# Patient Record
Sex: Female | Born: 1990 | Race: White | Hispanic: No | Marital: Single | Smoking: Never smoker
Health system: Southern US, Academic
[De-identification: ages and names within clinical notes are randomized; demographics above are authoritative.]

## PROBLEM LIST (undated history)

## (undated) DIAGNOSIS — Z803 Family history of malignant neoplasm of breast: Secondary | ICD-10-CM

## (undated) HISTORY — DX: Family history of malignant neoplasm of breast: Z80.3

## (undated) HISTORY — PX: HX OTHER: 2100001105

---
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
## 2010-01-04 ENCOUNTER — Ambulatory Visit
Payer: No Typology Code available for payment source | Attending: Cardiovascular Disease | Admitting: Cardiovascular Disease

## 2010-01-04 ENCOUNTER — Encounter (HOSPITAL_BASED_OUTPATIENT_CLINIC_OR_DEPARTMENT_OTHER): Payer: Self-pay | Admitting: Cardiovascular Disease

## 2010-01-04 DIAGNOSIS — R002 Palpitations: Secondary | ICD-10-CM | POA: Insufficient documentation

## 2010-01-04 NOTE — Patient Instructions (Signed)
Please call us if you have problems or questions.     [facility] Call Center:       Phone:  [phone]      Toll free: [phone]  [877 "[place] 4 HRT"]     These numbers are answered from about 7:30 AM - 5:30 PM Monday-Friday.   If you call after hours, there should be an option to leave a message or call   another number, through which you should be able to reach us.

## 2010-01-04 NOTE — Progress Notes (Signed)
See dictation. Please refer to scanned documents for additional details regarding review of systems, past medical and surgical histories, and family history.    Good response to beta-blocker; happy to continue.    rtc prn; f/u with primary.

## 2010-07-07 ENCOUNTER — Encounter (INDEPENDENT_AMBULATORY_CARE_PROVIDER_SITE_OTHER): Payer: Self-pay

## 2010-07-07 ENCOUNTER — Ambulatory Visit (INDEPENDENT_AMBULATORY_CARE_PROVIDER_SITE_OTHER): Payer: No Typology Code available for payment source | Admitting: Family Medicine

## 2010-07-07 NOTE — Progress Notes (Signed)
[facility]    Patient Name:  Christina Gibson  MRN:  [id number]  DOB:  01-27-1991  Date of Service: 07/07/2010    Chief Complaint   Patient presents with   . Knee Injury       History of Present Illness: TERIYAH PURINGTON is a 20 y.o. female who [facility] for the above complaint.    How did the injury occur? Left knee pain after playing basketball on 3/13 with friend. Had pain while playing and then more with stairs  Used an ace and icy hot that helped  Since stiff some mild pain until yesterday while walking on flat ground with pain and popping   now stiffness and popping and pain is on and off  No past injury or surgery    Past Medical History:    Past Medical History   Diagnosis Date   . Chest pain on exertion 11/02/2009   . Exertional dyspnea 11/02/2009   . Dizziness 11/02/2009       Past Surgical History:    History reviewed.  No pertinent past surgical history.  Allergies:  Allergies   Allergen Reactions   . Amoxicillin Hives/ Urticaria   . Pollen Extracts Itching and  Other Adverse Reaction (Add comment)   . Cat Hair Extract (Allergenic Extracts) Hives/ Urticaria       Medications:  Outpatient prescriptions marked as taking for the 07/07/10 encounter (Office Visit) with Berna Spare, Kelby Lotspeich BETH   Medication Sig   . metoprolol succinate (TOPROL-XL) 25 mg Tb24 take 1 Tab by mouth Once a day.        Social History:    History   Social History   . Marital Status: Single     Spouse Name: N/A     Number of Children: N/A   . Years of Education: N/A   Social History Main Topics   . Smoking status: Never Smoker    . Smokeless tobacco: Never Used   . Alcohol Use: Yes      occasional alcohol intake   . Drug Use: Not on file   . Sexually Active: Not on file   Other Topics Concern   . Not on file   Social History Narrative   . No narrative on file       Family History:  No family history on file.  Review of Systems:ROS is as per HPI and Cardiovascular: negative  Physical Exam:   Vital signs: BP 104/65   Pulse 66   Temp 36.8 C (98.2 F)   Ht 1.6 m (5\' 3" )   Wt 70.761 kg (156 lb)   BMI 27.63 kg/m2   LMP 06/22/2010  General: appears in good health  Musculoskeletal exam:   Left Knee: neg effusion no joint line tenderness  Area of pian under patella  FROM  Neg grind      Data Reviewed:  No labs or x-rays performed today.    Assessment:   1. 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 Knee pain (719.46J)      Plan:  No orders of the defined types were placed in this encounter.     Rest, ice, compression, and elevation Ibuprofen 200 mg, 3-4 PO TID, take with food PT discussed she is interested in PT currently but will consider in future.    Medications as ordered. I advised the patient to abstain from drinking any alcohol or using any drugs while ill and/or being treated with  the prescription medications given today.  Return if symptoms worsen or fail to improve.    Gemma Payor, MD

## 2010-09-20 NOTE — Letter (Signed)
[facility]  [address]  [place], [place] [address]    PATIENT NAME: Christina Gibson, Christina Gibson  CHART NUMBER: [id number]  DATE OF BIRTH: 1991/04/13  DATE OF SERVICE: 11/02/2009    November 02, 2009      Edison Pace, DO   [address]  [place], [place] [address]    Dear Dr. Ree Shay:    I saw Christina Gibson as a [facility] at the [facility] on November 02, 2009.  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  Later she became aware of exertional "heart pounding" which began in the chest and radiated to her neck and temples.  Episodes were sometimes accompanied by dizziness, shortness of breath, and chest discomfort.  Last week she experienced similar symptoms, which were the worst that she has ever had.  These lasted about 10-20 minutes.  Her symptoms occur only with exercise and never at rest.  She has no established history of heart disease or other significant medical problems.  She had a Holter monitor on September 29, 2009.  The interpretation noted that "patient had quite a bit of tachycardia."  The heart rate ranged from 47-195 beats a minute with a mean rate of 77 beats a minute.  There were 38 ventricular ectopics and 202 supraventricular ectopics.  The longest supraventricular run lasted 15 beats at a rate of 144 beats a minute and was recorded at 19:22:13.    Past history is negative for hypertension, diabetes, thyroid disease or rheumatic fever.  She has undergone removal of a cyst from her ear and oral surgery.  The family history is positive for "heart attack and stroke" in grandparents.    Review of systems was otherwise noncontributory.    She is on no medications.  She is allergic to amoxicillin, pollen extracts, and cat hair extract.     On exam, she was alert and in no apparent distress.  She was accompanied by her mother.  The blood pressure was 110/68.  The pulse was 86 and regular.  Respirations were regular and unlabored.  The room air oxygen saturation was 98%.  She weighed 156 pounds.  The body mass index was 27.6.  The face appeared normal.  There was no cutaneous rash or scleral icterus.  There was no jugular venous distention or carotid bruit.  Carotid upstrokes were normal.  I palpated no cervical adenopathy.  The isthmus of the thyroid was palpable, but otherwise there was no thyromegaly.  The spine was straight.  The chest was clear.  Heart sounds were regular and normal.  I heard no murmur, gallop or rub.  Radial pulses were intact.  There was no abdominal bruit.  There was no pedal edema.  Neurologically, she was grossly intact.    An electrocardiogram today showed normal sinus rhythm at 63 beats a minute.  There was an rSr prime pattern in V1 and V2, but the QRS duration was 84 ms, indicating a normal variant.    In summary, she has had palpitations, dyspnea, and chest discomfort exclusively with exercise.  She has a normal cardiovascular exam and EKG.  At this point, there is little evidence for significant cardiovascular disease.    To look into things further, I have ordered a treadmill exercise test, blood work,  CXR, and an echocardiogram.  She will return in three weeks to review the results, and I will try to keep you updated as things progress.  If there is anything important in her history which I have overlooked, please contact me at your earliest convenience.    Sincerely,      Gean Quint, MD  Professor, Section of Cardiology  [facility] Department of Medicine    [id number]; D: 11/02/2009 08:52:10; T: 11/02/2009 09:13:15    cc: Dietrich Pates PA-C      [address]      [place], [place] [address]

## 2010-09-20 NOTE — Letter (Signed)
[facility]  [address]  [place], [place] [address]    PATIENT NAME: Christina Gibson, Christina Gibson  CHART NUMBER: [id number]  DATE OF BIRTH: 1991-02-18  DATE OF SERVICE: 01/04/2010    January 04, 2010    Edison Pace, DO  [address]  [place], [place] [address]    Dear [name]:    I saw Christina Gibson in followup at the [facility] on January 04, 2010.  I previously saw her here on November 23, 2009, and her history is further detailed in my note of that date.  At that visit, we started her on Toprol-XL 25 mg daily to treat exercise related palpitations and she returned today to follow up on the results.    In general, she has felt much improved on the beta-blocker treatment.  She has taken it regularly, and if she omits a dose she is more bothered by exercise related tachycardia.    On exam, she was alert and pleasant.  Blood pressure was 116/64.  The pulse was 71 and regular.  Respirations were regular and unlabored.  The spine was straight.  The chest was clear.  Heart sounds were normal.  I heard no murmur, gallop or rub.    In summary, she has had a good symptomatic improvement on low dose beta-blocker therapy.    As I explained to her, our goal of treatment is symptom control.  It is fine with me for her to continue the beta-blocker indefinitely if this makes her feel better.    I have not scheduled a return visit, but I will be happy to see her in the future at any time you feel this might be helpful.  Please let me know if I can answer any questions.      Sincerely,      Gean Quint, MD  Professor, Section of Cardiology  [facility] Department of Medicine    [id number]; D: 01/04/2010 13:59:14; T: 01/04/2010 18:28:14

## 2010-09-20 NOTE — Letter (Signed)
[facility]  [address]  [place], [place] [address]    PATIENT NAME: Christina Gibson, Christina Gibson  CHART NUMBER: [id number]  DATE OF BIRTH: 07-06-1990  DATE OF SERVICE: 11/23/2009    November 23, 2009      Edison Pace, DO   [address]  [place], [place] [address]    Dear [name]:     I saw [name] in followup on November 23, 2009.  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  At her last visit, we ordered a battery of studies and she returned to review the results.  She has had no significant change in symptoms since that visit.    On exam, she was alert and pleasant.  The blood pressure was 108/78.  The aide recorded a pulse of 115 following today's exercise test.  The pulse was regular and on my exam somewhat slower.  The room air oxygen saturation was 96%.  She weighed 158 pounds.  The body mass index was 27.68.  There was no jugular venous distention.  The spine was straight.  The chest was clear.  Heart sounds were regular and normal.  I heard no murmur, gallop or rub.    I reviewed with her and her mother the results of her recent studies.  Blood work here showed a normal hemoglobin of 13.8 and a normal TSH of 2.9.  Her chest x-ray was normal.  An echocardiogram November 02, 2009, was normal and showed normal heart size, thickness, and function with no significant valve abnormalities.    Today on the treadmill she exercised 8 minutes 55 seconds on the Bruce protocol and achieved a peak workload of 10.1 METs.  At peak exertion, she had sinus tachycardia with a rate of 203 beats a minute.  With this, she had her symptoms of dyspnea, heart pounding, and chest discomfort.  There were no signs of ischemia.  Her heart rate was a bit slow to decrease following exertion, but this may be due to some element of anxiety and deconditioning.     In summary, there is no real evidence for significant heart disease.  Her symptoms occurred during sinus tachycardia.    I offered her an empiric trial of beta-blocker therapy and we will start this today with Toprol-XL 25 mg daily.  I will plan to see her back in two months.  I encouraged her to remain active but not push herself to the point of significant symptoms.  Hopefully, she can gradually recondition and work a bit on weight loss.  She knows to stop the Toprol if it causes untoward effects.  I will try to keep you updated as things progress.  Please let me know if I can answer any questions.    Sincerely,      Gean Quint, MD  Professor, Section of Cardiology  [facility] Department of Medicine    [id number]; D: 11/23/2009 14:10:02; T: 11/23/2009 19:21:44

## 2011-01-06 ENCOUNTER — Encounter (HOSPITAL_BASED_OUTPATIENT_CLINIC_OR_DEPARTMENT_OTHER): Payer: Self-pay | Admitting: Cardiovascular Disease

## 2011-01-06 NOTE — Progress Notes (Signed)
[facility] - 01/05/11 - 12:46 PM More Detail >>         RX Refill       Christina Gibson           Sent: Thu January 05, 2011 12:46 PM     To: P [facility]                  Marco Collie   20 y.o. / Female (June 14, 1990)    MRN: [id number]   PCP: Edison Pace, DO   Wt: Not available    Home: [phone]   Work: [phone]   Mobile: [phone]           Documentation          Summary: RX Refill      >> [name] 01/05/2011 12:46 PM  Patient would like a prescription for Metoprolol 25 mg called into [facility] on [address] in [place] (she doesn't know their phone number but she states she does not want it sent to [facility] in [facility]). Patient of Dr. Noreene Filbert.            Left 2 messages for pt, she has not returned the calls.  Pt not following with Dr Noreene Filbert now, needs to get Rx from her PCP.  Spoke with pharmacist, Rx already filled by another physician.        Call History            Type Contact Phone User     01/05/2011 12:44 PM Phone (Incoming) [address], Areyanna Figeroa (Self) [phone] Rexene Edison) Christina Gibson

## 2012-06-10 ENCOUNTER — Ambulatory Visit (HOSPITAL_BASED_OUTPATIENT_CLINIC_OR_DEPARTMENT_OTHER): Payer: Self-pay | Admitting: Multi-Specialty

## 2012-07-04 ENCOUNTER — Encounter (HOSPITAL_BASED_OUTPATIENT_CLINIC_OR_DEPARTMENT_OTHER): Payer: Self-pay | Admitting: Multi-Specialty

## 2012-07-04 ENCOUNTER — Ambulatory Visit
Admission: RE | Admit: 2012-07-04 | Discharge: 2012-07-04 | Disposition: A | Discharge status: Home / Self Care | Payer: No Typology Code available for payment source | Source: Ambulatory Visit | Attending: Multi-Specialty | Admitting: Multi-Specialty

## 2012-07-04 ENCOUNTER — Other Ambulatory Visit (HOSPITAL_BASED_OUTPATIENT_CLINIC_OR_DEPARTMENT_OTHER): Payer: Self-pay | Admitting: Multi-Specialty

## 2012-07-04 ENCOUNTER — Ambulatory Visit (HOSPITAL_BASED_OUTPATIENT_CLINIC_OR_DEPARTMENT_OTHER): Payer: No Typology Code available for payment source | Admitting: Multi-Specialty

## 2012-07-04 VITALS — BP 93/66 | HR 80 | Temp 97.5°F | Ht 62.8 in | Wt 161.4 lb

## 2012-07-04 DIAGNOSIS — Z124 Encounter for screening for malignant neoplasm of cervix: Secondary | ICD-10-CM | POA: Insufficient documentation

## 2012-07-04 DIAGNOSIS — R000 Tachycardia, unspecified: Secondary | ICD-10-CM | POA: Insufficient documentation

## 2012-07-04 DIAGNOSIS — Z0000 Encounter for general adult medical examination without abnormal findings: Secondary | ICD-10-CM | POA: Insufficient documentation

## 2012-07-04 DIAGNOSIS — J301 Allergic rhinitis due to pollen: Secondary | ICD-10-CM | POA: Insufficient documentation

## 2012-07-04 DIAGNOSIS — E049 Nontoxic goiter, unspecified: Secondary | ICD-10-CM | POA: Insufficient documentation

## 2012-07-05 NOTE — Progress Notes (Signed)
[facility]                                     [facility]      PATIENT NAME:             Christina Gibson, Christina Gibson                   MEDICAL RECORD NUMBER:    [id number]  DATE OF BIRTH:            [birth year]      DATE OF SERVICE:          07/04/2012    CHIEF COMPLAINT:   Establish care, physical.    HISTORY OF PRESENT ILLNESS:  The patient is a 22 year old female who presents to clinic for the above reason.  No acute issues or concerns today.      PAST MEDICAL HISTORY:  Significant for tachycardia with exercise.  She actually was prescribed Toprol-XL 25 mg--was seen by Dr. Noreene Filbert.  She apparently was told to take this daily but only uses it with exercise, just reports that her heart rate becomes very elevated with exercise and the Toprol helps with this.  When not exercising, she is not symptomatic. She does report that her blood pressure tends to run on the low side.  She also has a history of seasonal allergies.      PAST SURGICAL HISTORY:  Include a cyst removed from her ear and some dental implants.      CURRENT MEDICATION:  Toprol-XL 25 mg daily.    ALLERGIES:  She has allergies to amoxicillin, cat hair and pollen.    FAMILY HISTORY:  Remarkable for coronary artery disease in paternal and maternal grandfathers.  There is hyperlipidemia in maternal and paternal grandmothers.  Paternal grandfather also had a stroke due to an aneurysm.  Mother has seizure disorder.    SOCIAL HISTORY:  She is a Holiday representative, Therapist, art at [employer].  She also has a job in Consulting civil engineer.  She is sexually active, has a female partner.  She smokes occasionally.  Denies drug use other than occasional marijuana.  She does drink sometimes on the weekends, but typically will only have 3-4 drinks, does not typically binge.     REVIEW OF SYSTEMS:  She has never had a Pap smear.  Denies vaginal discharge or lesions.  Menstrual cycles are regular.  She does have sinus congestion, rhinorrhea, has not tried anything really for those symptoms.  She also has a history of tachycardia.  No chest pain or shortness of breath.  No abdominal pain. ROS of 10 systems are otherwise negative.    OBJECTIVE:  Vitals are stable.  Blood pressure is actually on the low side, 93/66, pulse 80, afebrile.  Weight is 161 pounds with BMI of 28.  She is alert and oriented, pleasant, well appearing, in no distress.  HEENT:  NC/AT, TMs clear bilaterally.  EACs clear bilaterally.  Pupils equally round, reactive to light with no conjunctival injection.  Oropharynx is pink and moist with no oral lesions.  Neck:  Supple.  She does have what feels like some diffuse thyromegaly but no nodules are noted.  No lymphadenopathy.  Heart:  Regular rate and rhythm without murmur, rub or gallop.  No ectopic beats.  Lungs:  Clear to auscultation bilaterally without crackles or wheezes.  Breast exam:  Reveals no  discrete masses.  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  There is no nipple discharge, no overlying skin changes.  Abdomen:  Soft and nontender with no palpable masses, no palpable hepatosplenomegaly.  Extremities:  Without any pitting edema.  Neuro:  No focal deficits.  Musculoskeletal exam:  Reveals no joint swelling.  Skin exam:  Reveals no rash and no suspicious nevi.  She does have very pale skin GU:  External genitalia without lesions.  Vaginal mucosa is pink and moist.  Cervix is well-appearing. There is no cervical discharge and no cervical motion tenderness.  Uterus is small.  Adnexa are without masses or fullness.    ASSESSMENT AND PLAN:  1.  Healthcare maintenance.  Pap smear performed today.  She reports being up to date on all of her immunizations.   2.  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  This could just be that she has more thick and prominent tissue in this area.  Will get a thyroid ultrasound for further evaluation.  3.  History of tachycardia.  I am actually concerned about the Toprol because her blood pressure already runs low.  I am concerned about dropping her too low and actually causing syncopal episode or other problems.  I advised her to decrease her Toprol to 12.5 mg when she does take it.   4.  Seasonal allergies.  Consider antihistamines versus nasal steroids.  Can discuss more in the future if symptoms become bothersome.  Right now they are not really too significant per patient.      Shaune Pascal, MD  Assistant Professor  [facility]    [id number]; D: 07/04/2012 19:35:20; T: 07/05/2012 10:07:49

## 2012-07-09 LAB — HISTORICAL CYTOPATHOLOGY-GYN (PAP AND HPV TESTS)

## 2012-07-19 ENCOUNTER — Ambulatory Visit
Admission: RE | Admit: 2012-07-19 | Discharge: 2012-07-19 | Disposition: A | Discharge status: Home / Self Care | Payer: No Typology Code available for payment source | Source: Ambulatory Visit | Attending: Multi-Specialty | Admitting: Multi-Specialty

## 2012-07-19 DIAGNOSIS — E049 Nontoxic goiter, unspecified: Secondary | ICD-10-CM | POA: Insufficient documentation

## 2012-07-19 NOTE — Progress Notes (Signed)
Quick Note:    Your thyroid ultrasound is normal. No nodules or other abnormalities. Let me know if you have any questions.  Shaune Pascal, MD      [facility]  [address]  [place] [address]    ______

## 2012-07-31 ENCOUNTER — Encounter: Payer: Self-pay | Admitting: Multi-Specialty

## 2012-12-04 ENCOUNTER — Encounter (HOSPITAL_BASED_OUTPATIENT_CLINIC_OR_DEPARTMENT_OTHER): Payer: Self-pay | Admitting: Multi-Specialty

## 2012-12-24 ENCOUNTER — Ambulatory Visit (HOSPITAL_BASED_OUTPATIENT_CLINIC_OR_DEPARTMENT_OTHER): Payer: No Typology Code available for payment source | Admitting: Multi-Specialty

## 2012-12-26 ENCOUNTER — Ambulatory Visit
Admission: RE | Admit: 2012-12-26 | Discharge: 2012-12-26 | Disposition: A | Discharge status: Home / Self Care | Payer: No Typology Code available for payment source | Source: Ambulatory Visit | Attending: Family | Admitting: Family

## 2012-12-26 ENCOUNTER — Ambulatory Visit (HOSPITAL_BASED_OUTPATIENT_CLINIC_OR_DEPARTMENT_OTHER): Payer: No Typology Code available for payment source | Admitting: Family

## 2012-12-26 VITALS — BP 101/52 | HR 90 | Temp 98.3°F | Resp 20 | Wt 163.1 lb

## 2012-12-26 DIAGNOSIS — N898 Other specified noninflammatory disorders of vagina: Secondary | ICD-10-CM | POA: Insufficient documentation

## 2012-12-26 DIAGNOSIS — Z113 Encounter for screening for infections with a predominantly sexual mode of transmission: Secondary | ICD-10-CM | POA: Insufficient documentation

## 2012-12-26 LAB — WETMOUNT: WETMOUNT: NONE SEEN

## 2012-12-26 NOTE — Progress Notes (Addendum)
SUBJECTIVE:  Christina Gibson is a 22 y.o. female is here today for Irregular Bleeding  Patient present with complaint of vaginal bleeding during intercourse. She reports having regular menstrual periods every 28 days without intermenstrual bleeding. She had a normal pap-smear in March, but she was not sexually active during that time to know if she had the bleeding with intercourse. She has same sex partner and initially thought the bleeding was from nails scratching her. The bleeding was so bad the last time, "they had to stop". After stopping intercourse, bleeding resolved on its own with no further bleeding the next day. Denies pelvic pain or painful intercourse. Has family history of uterine fibroids.     Outpatient Prescriptions Prior to Visit:  metoprolol succinate (TOPROL-XL) 25 mg Tb24 take 1 Tab by mouth Once a day.     No facility-administered medications prior to visit.  Review of Systems - as per HPI.  Patient denies fever and chills, or irregular menstrual bleeding.  Otherwise review of systems negative.    OBJECTIVE:   Vitals: BP 101/52   Pulse 90   Temp(Src) 36.8 C (98.3 F)   Resp 20   Wt 74 kg (163 lb 2.3 oz)   BMI 29.09 kg/m2   General: appears in good health  Abdomen: soft, non-tender, bowel sounds normal and non-distended  Pelvic exam: exam chaperoned by nurse, normal vagina and vulva, vaginal discharge described as white, creamy and malodorous, uterus normal size, shape, consistency, no mass or tenderness.     ASSESSMENT:   PLAN:    ICD-9-CM    1. Abnormal vaginal bleeding 623.8 [place] FEMALE PELVIS   2. Screening for STD (sexually transmitted disease) V74.5 NEISSERIA GONORRHOEAE DNA BY PCR     CHLAMYDIA TRACHOMITIS DNA BY PCR (INHOUSE)     [place](POC)     HIV1/HIV2 SCREEN, COMBINED ANTIGEN AND ANTIBODY    Advised her to go to ED if her bleeding was enough to saturate several pads in one hour, or if the bleeding were not to stop. Will inform her of test results and make a further plan accordingly.   Return if symptoms worsen or fail to improve.  Patient was seen independently, co-signing physician on site if consultation was needed.    Hollice Gong, FNP-BC  FAMILY MED-CHEAT  Operated by [facility]  [address]  [place] [address]  Dept: [phone]    Alphonsa Overall, MD

## 2012-12-27 ENCOUNTER — Ambulatory Visit
Admission: RE | Admit: 2012-12-27 | Discharge: 2012-12-27 | Disposition: A | Discharge status: Home / Self Care | Payer: No Typology Code available for payment source | Source: Ambulatory Visit | Attending: Multi-Specialty | Admitting: Multi-Specialty

## 2012-12-27 DIAGNOSIS — Z113 Encounter for screening for infections with a predominantly sexual mode of transmission: Secondary | ICD-10-CM | POA: Insufficient documentation

## 2012-12-27 LAB — NEISSERIA GONORRHOEAE DNA BY PCR: NEISSERIA GONORRHOEAE: NEGATIVE

## 2012-12-27 LAB — HIV1/HIV2 SCREEN, COMBINED ANTIGEN AND ANTIBODY: HIV SCREEN, COMBINED ANTIGEN & ANTIBODY: NEGATIVE

## 2012-12-27 LAB — CHLAMYDIA TRACHOMITIS DNA BY PCR (INHOUSE): CHLAMYDIA TRACHOMATIS: NEGATIVE

## 2012-12-30 ENCOUNTER — Ambulatory Visit
Admission: RE | Admit: 2012-12-30 | Discharge: 2012-12-30 | Disposition: A | Discharge status: Home / Self Care | Payer: No Typology Code available for payment source | Source: Ambulatory Visit | Attending: Family | Admitting: Family

## 2012-12-30 DIAGNOSIS — N898 Other specified noninflammatory disorders of vagina: Secondary | ICD-10-CM | POA: Insufficient documentation

## 2013-02-08 ENCOUNTER — Other Ambulatory Visit: Payer: Self-pay

## 2013-02-10 ENCOUNTER — Other Ambulatory Visit: Payer: Self-pay

## 2013-07-16 ENCOUNTER — Encounter (INDEPENDENT_AMBULATORY_CARE_PROVIDER_SITE_OTHER): Payer: Self-pay

## 2013-07-16 ENCOUNTER — Ambulatory Visit (INDEPENDENT_AMBULATORY_CARE_PROVIDER_SITE_OTHER): Payer: Self-pay | Admitting: Ophthalmology

## 2013-07-16 ENCOUNTER — Ambulatory Visit (INDEPENDENT_AMBULATORY_CARE_PROVIDER_SITE_OTHER): Payer: No Typology Code available for payment source

## 2013-07-16 VITALS — BP 134/78 | HR 88 | Temp 98.1°F | Resp 18 | Ht 63.0 in | Wt 171.3 lb

## 2013-07-16 DIAGNOSIS — S0500XA Injury of conjunctiva and corneal abrasion without foreign body, unspecified eye, initial encounter: Secondary | ICD-10-CM

## 2013-07-16 DIAGNOSIS — S058X9A Other injuries of unspecified eye and orbit, initial encounter: Secondary | ICD-10-CM

## 2013-07-16 MED ORDER — ERYTHROMYCIN 5 MG/GRAM (0.5 %) EYE OINTMENT
TOPICAL_OINTMENT | Freq: Four times a day (QID) | OPHTHALMIC | Status: AC
Start: 2013-07-16 — End: ?

## 2013-07-16 NOTE — Telephone Encounter (Signed)
Scheduled pt to see dr Christell Constantmoore tomorrow at 10:00 am for NPV Slot

## 2013-07-16 NOTE — Progress Notes (Addendum)
History of Present Illness: Christina ShaversRachel Joy Duffett is a 23 y.o. female who presents to the [facility]  today with chief complaint of    Chief Complaint    Eye Pain right eye    Eye Redness       .     Location: right eye   Quality: irritation/pain/redness   Onset: x2 days   Severity: mild to moderate   Timing: continuing   Context: Pt reports eye sxs for the past 2 days. Pt does not report any known trauma to the eye. Pt denies any other medical problems. Pt denies taking any regular medications. Pt denies any known drug allergies. Pt denies known sick contacts with similar sxs.   Modifying factors: Pt does not report taking medication for relief of sxs.   Associated symptoms: eye redness, eye pain, eye discharge, foreign body sensation   Denies: photophobia, visual changes, N/V/D, abd pain, sore throat, cough, myalgias, fatigue, headache, rashes     I reviewed and confirmed the patient's past medical history taken by the nurse or medical assistant with the addition of the following:    Past Medical History:    Past Medical History   Diagnosis Date    Chest pain on exertion 11/02/2009    Exertional dyspnea 11/02/2009    Dizziness 11/02/2009     Past Surgical History:    History reviewed. No pertinent past surgical history.  Allergies:  Allergies   Allergen Reactions    Amoxicillin Hives/ Urticaria    Cat Hair Extract [Allergenic Extracts] Hives/ Urticaria    Pollen Extracts Itching and  Other Adverse Reaction (Add comment)     Medications:    Current Outpatient Prescriptions   Medication Sig    erythromycin (ROMYCIN) 5 mg/gram (0.5 %) Ophthalmic Ointment Instill into right eye Four times a day Instill 1/2 inch 4 times/day    metoprolol succinate (TOPROL-XL) 25 mg Tb24 take 1 Tab by mouth Once a day.     Social History:    History   Substance Use Topics    Smoking status: Never Smoker     Smokeless tobacco: Never Used    Alcohol Use: Yes      Comment: occasional alcohol intake     Family History:  No significant family history.  Family History   Problem Relation Age of Onset    Elevated Lipids Mother     Seizures Mother     Elevated Lipids Father      Review of Systems:    General: no fever and no fatigue  ENT:  no sore throat  Eyes:  pain, foreign body sensation, discharge, no photophobia, redness and no visual change  Pulmonary:   no dry cough  Gastrointestinal:  no nausea, no vomiting, no diarrhea and no abdominal pain  Musculoskeletal:  no myalgias  Neurologic:  no headache  Skin:  no rash  All other review of systems were negative    Physical Exam:  Vital signs:   Filed Vitals:    07/16/13 1321   BP: 134/78   Pulse: 88   Temp: 36.7 C (98.1 F)   TempSrc: Thermal Scan   Resp: 18   Height: 1.6 m (5\' 3" )   Weight: 77.701 kg (171 lb 4.8 oz)   SpO2: 97%     Patient's last menstrual period was 06/19/2013.      General:  Well appearing and No acute distress  Head:  Normocephalic and Atraumatic  Eyes:  Normal lids/lashes, PERRL, EOMI,  no foreign body, no cells/flare, injected right conjunctiva, negative seidel test, area of fluorescein dye uptake at 3 oclock position of right eye, corneal abrasion of right eye   ENT:  normal EAC's, normal TM's, MMM, normal pharynx/tonsils, no peritonsillar mass and normal tongue/uvula  Neck:  supple  Pulmonary:  clear to auscultation bilaterally, no wheezes, no rales and no rhonchi  Cardiovascular:  regular rate/rhythm, normal S1/S2 and no murmur/rub/gallop  Skin:  warm/dry and no rash  Psychiatric:  Appropriate affect and behavior and Normal speech  Neurologic:   Alert and oriented x 3  Hem/Lymph:  No lymphadenopathy    Data Reviewed:    Point-of-care testing:           Comment: 20/13  Initials: kw  Comment: 20/13  Initials: kw             Course: Condition at discharge: Good     Differential Diagnosis: corneal abrasion vs viral conjunctivitis vs bacterial conjunctivitis vs allergic conjunctivitis     Assessment:   1. Corneal abrasion        Plan:    Orders Placed This  Encounter    Referral to Ophthalmology    POCT VISION/HEARING SCREEN(AMB-CLIN SUPP)    erythromycin (ROMYCIN) 5 mg/gram (0.5 %) Ophthalmic Ointment          Advised pt to take medication as prescribed.   Referred pt to Ophthalmology.     Go to Emergency Department immediately for further work up if any concerning symptoms.  Plan was discussed and patient verbalized understanding.  If symptoms are worsening or not improving the patient should return to the [facility]gent Care[facility] for further evaluation.    I am scribing for, and in the presence of, Dr. Gretchen Short for services provided on 07/16/2013.  Caralyn Guile, SCRIBE     Kevin, [place] 07/16/2013, 14:02    I personally performed the services described in this documentation, as scribed  in my presence, and it is both accurate  and complete.    Gretchen Short, MD

## 2013-07-16 NOTE — Patient Instructions (Signed)
Corneal Abrasion  The cornea is the clear covering at the front and center of the eye. When looking at the colored portion of the eye (iris), you are looking through the cornea. This very thin tissue is made up of many layers. The surface layer is a single layer of cells (corneal epithelium) and is one of the most sensitive tissues in the body. If a scratch or injury causes the corneal epithelium to come off, it is called a corneal abrasion. If the injury extends to the tissues below the epithelium, the condition is called a corneal ulcer.  CAUSES    Scratches.   Trauma.   Foreign body in the eye.  Some people have recurrences of abrasions in the area of the original injury even after it has healed (recurrent erosion syndrome). Recurrent erosion syndrome generally improves and goes away with time.  SYMPTOMS    Eye pain.   Difficulty or inability to keep the injured eye open.   The eye becomes very sensitive to light.   Recurrent erosions tend to happen suddenly, first thing in the morning, usually after waking up and opening the eye.  DIAGNOSIS   Your health care provider can diagnose a corneal abrasion during an eye exam. Dye is usually placed in the eye using a drop or a small paper strip moistened by your tears. When the eye is examined with a special light, the abrasion shows up clearly because of the dye.  TREATMENT    Small abrasions may be treated with antibiotic drops or ointment alone.   A pressure patch may be put over the eye. If this is done, follow your doctor's instructions for when to remove the patch. Do not drive or use machines while the eye patch is on. Judging distances is hard to do with a patch on.  If the abrasion becomes infected and spreads to the deeper tissues of the cornea, a corneal ulcer can result. This is serious because it can cause corneal scarring. Corneal scars interfere with light passing through the cornea and cause a loss of vision in the involved eye.  HOME CARE  INSTRUCTIONS   Use medicine or ointment as directed. Only take over-the-counter or prescription medicines for pain, discomfort, or fever as directed by your health care provider.   Do not drive or operate machinery if your eye is patched. Your ability to judge distances is impaired.   If your health care provider has given you a follow-up appointment, it is very important to keep that appointment. Not keeping the appointment could result in a severe eye infection or permanent loss of vision. If there is any problem keeping the appointment, let your health care provider know.  SEEK MEDICAL CARE IF:    You have pain, light sensitivity, and a scratchy feeling in one eye or both eyes.   Your pressure patch keeps loosening up, and you can blink your eye under the patch after treatment.   Any kind of discharge develops from the eye after treatment or if the lids stick together in the morning.   You have the same symptoms in the morning as you did with the original abrasion days, weeks, or months after the abrasion healed.  MAKE SURE YOU:    Understand these instructions.   Will watch your condition.   Will get help right away if you are not doing well or get worse.  Document Released: 03/31/2000 Document Revised: 04/08/2013 Document Reviewed: 12/09/2012  [facility] Patient Information 2015 [place], [place].  This information is not intended to replace advice given to you by your health care provider. Make sure you discuss any questions you have with your health care provider.         [facility] [employer] [facility] [employer]      Operated by [facility]  [address] [place], [place] [address]  Phone: [phone])  Fax: [phone]  [email or web address]  Open Daily 8:00a - 8:00p    Closed Thanksgiving and Christmas Day  [facility]     Operated by [facility]  [address]  [facility]  [place], [place] [address]  Phone: [phone]  [phone])  Fax: [phone]  [email or web address]  Open M-F  8:00a - 8:00p  Sat              10:00a - 4:00p  Sun             Closed  Closed all [place] Holidays        Attending Caregiver: Gretchen Short, MD      Today's orders:   Orders Placed This Encounter    Referral to Ophthalmology    POCT VISION/HEARING SCREEN(AMB-CLIN SUPP)    erythromycin (ROMYCIN) 5 mg/gram (0.5 %) Ophthalmic Ointment        Prescription(s) E-Rx to:  [facility] - [place]lee [place], [place] - [address]    ________________________________________________________________________  Short Term Disability and Family Medical Leave Act  [facility] does NOT provide assistance with any disability applications.  If you feel your medical condition requires you to be on disability, you will need to follow up with  Your primary care physician or a specialist.  We apologize for any inconvenience.    For Medication Prescribed by [facility]:  As an Urgent Care facility, our clinic does NOT offer prescription refills over the telephone.    If you need more of the medication one of our medical providers prescribed, you will  Either need to be re-evaluated by [place] or see your primary care physician.    ________________________________________________________________________      It is very important that we have a phone number that is the single best way to contact you in the event that we become aware of important clinical information or concerns after your discharge.  If the phone number you provided at registration is NOT this number you should inform staff and registration prior to leaving.      Your treatment and evaluation today was focused on identifying and treating potentially emergent conditions based on your presenting signs, symptoms, and history.  The resulting initial clinical impression and treatment plan is not intended to be definitive or a substitute for a full physical examination and evaluation by your primary care  provider.  If your symptoms persist, worsen, or you develop any new or concerning symptoms, you need to be evaluated.      If you received x-rays during your visit, be aware that the final and formal interpretation of those films by a radiologist may occur after your discharge.  If there is a significant discrepancy identified after your discharge, we will contact you at th telephone number provided at registration.      If you received a pelvic exam, you may have cultures pending for sexually transmitted diseases.  Positive cultures are reported to the [facility] Department of Health as required by state law.  You should be contacted if you cultures are positive.  We will not  contact you if they are negative.  You may contact the Health Information Management Office of [facility] to get a copy of your results.  You did NOT receive a PAP smear (the screening test for cervical).  This specific test for women is best performed by your gynecologist or primary care provider when indicated.      If you are over 23 year old, we cannot discuss your personal health information with a parent, spouse, family member, or anyone else without your express consent.  This does not include those who have legitimate access to your records and information to assist in your care under the provisions of HIPAA [facility](Health Insurance Portability and Accountability Act) law, or those to whom you have previously given express written consent to do so, such a legal guardian or Power of [place].      You may have received medication that may cause you to feel drowsy and/or light headed for several hours.  You may even experience some amnesia of your stay.  You should avoid operating a motor vehicle or performing any activity requiring complete alertness or coordination until you feel fully awake (approximately 24-48 hours).  Avoid alcoholic beverages.  You may also have a dry mouth for several hours.  This is a normal side effect and will  disappear as the effects of the medication wear off.      Instructions discussed with patient upon discharge by clinical staff with all questions answered.  Please call [facility] [phone]) if any further questions.  Go immediately to the emergency department if any concern or worsening symptoms.      Gretchen ShortStephen Bently Wyss, MD 07/16/2013, 14:01

## 2013-07-16 NOTE — Telephone Encounter (Signed)
Message copied by Gar GibbonBLUMETTI, Brandon Wiechman D on Wed Jul 16, 2013  3:21 PM  ------       Message from: Boykin ReaperSMITH, SHAREE K       Created: Wed Jul 16, 2013  3:13 PM         >> Blenda PealsSHAREE K SMITH 07/16/2013 03:13 PM       [facility] called to refer Christina Gibson for a corneal abrasion x2 days     [facility] would like Christina Gibson to be seen asap    There is an order in Epic for the referral     Please call Christina Gibson at [phone] with the date/time of the appointment  ------

## 2013-07-17 ENCOUNTER — Ambulatory Visit: Payer: No Typology Code available for payment source | Attending: Ophthalmology | Admitting: Ophthalmology

## 2013-07-17 ENCOUNTER — Encounter (INDEPENDENT_AMBULATORY_CARE_PROVIDER_SITE_OTHER): Payer: Self-pay | Admitting: Ophthalmology

## 2013-07-17 DIAGNOSIS — S0500XA Injury of conjunctiva and corneal abrasion without foreign body, unspecified eye, initial encounter: Secondary | ICD-10-CM

## 2013-07-17 DIAGNOSIS — S058X9A Other injuries of unspecified eye and orbit, initial encounter: Secondary | ICD-10-CM | POA: Insufficient documentation

## 2013-07-17 DIAGNOSIS — H16049 Marginal corneal ulcer, unspecified eye: Secondary | ICD-10-CM

## 2013-07-17 MED ORDER — OFLOXACIN 0.3 % EYE DROPS
1.00 [drp] | Freq: Four times a day (QID) | OPHTHALMIC | Status: AC
Start: 2013-07-17 — End: ?

## 2013-07-17 NOTE — Progress Notes (Addendum)
[facility]HTHALMOLOGY-EYE INSTITUTE  Operated by [facility]  [address]  [place] [id number]  Dept: [phone]    [facility]achel [facility]  MRN#: [id number]  Birthdate: 1990-07-04    Date of Service: 07/17/2013    Chief Complaint    Eye Problem          Keturah ShaversRachel Gibson Gibson is a 23 y.o. female who presents today for evaluation/consultation of:  HPI    Pt states she may have gotten an eye injury Sunday 07/13/2013. She states the right eye started hurting and became red on Monday. Pt states the pain today is about 4-5 on pain scale. Pt was seen at [facility] yesterday and told she may have a corneal abrasion OD. Pt was given emycin ointment for the right eye and referred here.          ROS    Positive for: Eyes    Negative for: Constitutional, Gastrointestinal, Neurological, Skin, Genitourinary, Musculoskeletal, HENT, Endocrine, Cardiovascular, Respiratory, Psychiatric, Allergic/Imm, Heme/Lymph          Koren Shiveregina B Starn, COA 07/17/2013, 10:15  MD Addition to HPI:  Patient was playing with niece on Sunday and niece was playing with patient's eyelids then on Monday started getting red and painful. Went to [facility] yesterday and given erythromycin ointment. This morning it feels much better    S: See progess note.  O: See exam section         A/P:  1) K abrasion  -Epi defect resolved, 2x1 vertical area on nasal limbus, with some infiltrate  -continue erythromycin ointment QHS OD, add Ocuflox TID during day  -recheck 1 week       Past Surgical History   Procedure Laterality Date    Hx other       hx oral surgery/gum surgery       Past Medical History   Diagnosis Date    Chest pain on exertion 11/02/2009    Exertional dyspnea 11/02/2009    Dizziness 11/02/2009       Patient Active Problem List   Diagnosis    Palpitations       Family History:  Family History   Problem Relation Age of Onset    Elevated Lipids Mother     Seizures Mother     Elevated Lipids Father        Social History:     History     Social  History    Marital Status: Single     Spouse Name: N/A     Number of Children: N/A    Years of Education: N/A     Occupational History     [employer] [place]     Social History Main Topics    Smoking status: Never Smoker     Smokeless tobacco: Never Used    Alcohol Use: Yes      Comment: occasional alcohol intake    Drug Use: Not on file    Sexual Activity: Not on file     Other Topics Concern    Not on file     Social History Narrative    No narrative on file        History   Substance Use Topics    Smoking status: Never Smoker     Smokeless tobacco: Never Used    Alcohol Use: Yes      Comment: occasional alcohol intake       I reviewed and made necessary changes to the technician,  resident or fellow note regarding CC/HPI/ROS/Past Family, Medical and Social Hx/Surg Hx.  Lily Kocher, MD 07/17/2013, 10:29        Lily Kocher, MD 07/17/2013, 10:29        I reviewed and made necessary changes to the technician, resident or fellow note regarding CC/HPI/ROS/Past Family, Medical and Social Hx/Surg Hx.  Kathleen Argue, MD 07/17/2013, 10:58      I saw and examined the patient.  I reviewed the resident's note.  I agree with the findings and plan of care as documented in the resident's note.  Any exceptions/additions are edited/noted.    Kathleen Argue, MD 07/17/2013, 10:58

## 2013-07-28 ENCOUNTER — Ambulatory Visit: Payer: No Typology Code available for payment source | Attending: Ophthalmology | Admitting: Ophthalmology

## 2013-07-28 ENCOUNTER — Encounter (INDEPENDENT_AMBULATORY_CARE_PROVIDER_SITE_OTHER): Payer: Self-pay | Admitting: Ophthalmology

## 2013-07-28 DIAGNOSIS — H16049 Marginal corneal ulcer, unspecified eye: Secondary | ICD-10-CM | POA: Insufficient documentation

## 2013-07-28 NOTE — Progress Notes (Addendum)
OPHTHALMOLOGY-EYE INSTITUTE  [facility] by [facility]  [address]  [place] [id number]  Dept: [phone]    Patient Name: [facility]Rachel Joy [facility]  MRN#: [id number]  Birthdate: Mar 24, 1991    Date of Service: 07/28/2013    Chief Complaint    Follow Up Cornea Problem          Keturah ShaversRachel Joy Gibson is a 23 y.o. female who presents today for evaluation/consultation of:  HPI    1 week f/u k abrasion OD  Pt states everything feels good; progressively improved x LOV  Denies any pain / discomfort  Denies any VA changes OU @ dist and near  Pt using Ocuflox OD TID; did not use yesterday but otherwise compliant        ROS    Positive for: Eyes (abrasion f/u)    Negative for: Constitutional, Gastrointestinal, Neurological, Skin, Genitourinary, Musculoskeletal, HENT, Endocrine, Cardiovascular, Respiratory, Psychiatric, Allergic/Imm, Heme/Lymph          Charlsie Merleslexandria Nicole Fox, [facility] 07/28/2013, 09:59    Past Surgical History   Procedure Laterality Date    Hx other       hx oral surgery/gum surgery       Past Medical History   Diagnosis Date    Chest pain on exertion 11/02/2009    Exertional dyspnea 11/02/2009    Dizziness 11/02/2009       Patient Active Problem List   Diagnosis    Palpitations       Family History:  Family History   Problem Relation Age of Onset    Elevated Lipids Mother     Seizures Mother     Elevated Lipids Father        Social History:     History   Substance Use Topics    Smoking status: Never Smoker     Smokeless tobacco: Never Used    Alcohol Use: Yes      Comment: occasional alcohol intake       MD Addition to HPI: Patient here for ?ulcer follow up. Has been using ocuflox TID OD. Reports resolved FB sensation, photophobia and tearing. No longer using ointment. Feels like everything is back to normal.         Assessment:    1. Marginal corneal ulcer        Ophthalmic Plan of Care:  -Resolved  -Discontinue drops/ointment  -follow up 2 week     Follow up:    I have asked [name] to follow up in 2  weeks         Lily KocherJesse Theodore Himebaugh, MD 07/28/2013, 10:11    I have seen and examined the above patient. I discussed the above diagnoses listed in the assessment and the above ophthalmic plan of care with the patient and patient's family. All questions were answered. I reviewed and, when necessary, made changes to the technician/resident note, documented ophthalmology exam, chief complaint, history of present illness, allergies, review of systems, past medical, past surgical, family and social history.    No orders of the defined types were placed in this encounter.       No orders of the defined types were placed in this encounter.       I saw and examined the patient.  I reviewed the resident's note.  I agree with the findings and plan of care as documented in the resident's note.  Any exceptions/additions are edited/noted.    Carmell Austriaonald Leon Selyna Klahn, MD 07/28/2013, 11:21

## 2013-08-11 ENCOUNTER — Ambulatory Visit: Payer: No Typology Code available for payment source | Attending: Ophthalmology | Admitting: Ophthalmology

## 2013-08-11 DIAGNOSIS — H16049 Marginal corneal ulcer, unspecified eye: Secondary | ICD-10-CM | POA: Insufficient documentation

## 2013-08-11 NOTE — Progress Notes (Addendum)
OPHTHALMOLOGY-EYE INSTITUTE  [facility] by [facility]  [address]  [place] [id number]  Dept: [phone]    Patient Name: Christina Gibson  MRN#: [id number]  Birthdate: 1991/02/15    Date of Service: 08/11/2013    Chief Complaint    Follow Up Cornea Problem          Christina Gibson is a 23 y.o. female who presents today for evaluation/consultation of:  HPI    Pt here for 2 week follow up after she scratched OD. Pt states vision is the same, pt thinks it has healed. Pt states not having any eye pain.         ROS    Negative for: Constitutional, Gastrointestinal, Neurological, Skin, Genitourinary, Musculoskeletal, HENT, Endocrine, Cardiovascular, Eyes, Respiratory, Psychiatric, Allergic/Imm, Heme/Lymph          Maxine GlennJessica L Smallwood, Ophthalmic T 08/11/2013, 08:12    Past Surgical History   Procedure Laterality Date    Hx other       hx oral surgery/gum surgery       Past Medical History   Diagnosis Date    Chest pain on exertion 11/02/2009    Exertional dyspnea 11/02/2009    Dizziness 11/02/2009       Patient Active Problem List   Diagnosis    Palpitations       Family History:  Family History   Problem Relation Age of Onset    Elevated Lipids Mother     Seizures Mother     Elevated Lipids Father        Social History:     History   Substance Use Topics    Smoking status: Never Smoker     Smokeless tobacco: Never Used    Alcohol Use: Yes      Comment: occasional alcohol intake       MD Addition to HPI:   Patient here for follow up corneal ulcer, OD. Patient stopped using drops 2 weeks ago. Just coming back for follow up. No complaints. Vision remains good.          Assessment:    1. Marginal corneal ulcer        Ophthalmic Plan of Care:  Resolved ulcer without infiltrate or epi defect  -f/u prn    Follow up:    I have asked Christina Gibson to follow up in future PRN if symptoms worsen or fail to improve         Lily KocherJesse Theodore Himebaugh, MD 08/11/2013, 08:15    I have seen and examined the above patient. I  discussed the above diagnoses listed in the assessment and the above ophthalmic plan of care with the patient and patient's family. All questions were answered. I reviewed and, when necessary, made changes to the technician/resident note, documented ophthalmology exam, chief complaint, history of present illness, allergies, review of systems, past medical, past surgical, family and social history.    No orders of the defined types were placed in this encounter.       No orders of the defined types were placed in this encounter.           I saw and examined the patient.  I reviewed the resident's note.  I agree with the findings and plan of care as documented in the resident's note.  Any exceptions/additions are edited/noted.    Lincoln BrighamGhassan Richard Panayiotis [place], MD 08/11/2013, 09:31

## 2014-01-21 ENCOUNTER — Other Ambulatory Visit: Payer: Self-pay

## 2015-03-14 ENCOUNTER — Other Ambulatory Visit: Payer: Self-pay

## 2016-03-18 ENCOUNTER — Other Ambulatory Visit: Payer: Self-pay

## 2020-04-02 ENCOUNTER — Telehealth: Payer: Self-pay | Admitting: Genetic Counselor

## 2020-04-02 NOTE — Telephone Encounter (Signed)
Received a genetic counseling referral from [facility] for eval for ATM gene. Ms. Lacek returned my call and has been scheduled to see Santiago Glad on 1/10 at [place]. Pt aware to arrive 30 minutes early.

## 2020-04-23 ENCOUNTER — Telehealth: Payer: Self-pay | Admitting: Genetic Counselor

## 2020-04-23 NOTE — Telephone Encounter (Signed)
LM on VM for patient to call me back about her appointment at 9 AM on Monday.

## 2020-04-26 ENCOUNTER — Encounter: Payer: Self-pay | Admitting: Genetic Counselor

## 2020-04-26 ENCOUNTER — Ambulatory Visit (HOSPITAL_BASED_OUTPATIENT_CLINIC_OR_DEPARTMENT_OTHER): Payer: BC Managed Care – PPO | Admitting: Genetic Counselor

## 2020-04-26 ENCOUNTER — Other Ambulatory Visit: Payer: Self-pay

## 2020-04-26 DIAGNOSIS — Z800 Family history of malignant neoplasm of digestive organs: Secondary | ICD-10-CM

## 2020-04-26 DIAGNOSIS — Z803 Family history of malignant neoplasm of breast: Secondary | ICD-10-CM | POA: Diagnosis not present

## 2020-04-26 HISTORY — DX: Family history of malignant neoplasm of digestive organs: Z80.0

## 2020-04-26 NOTE — Progress Notes (Signed)
REFERRING PROVIDER: Liberty Handy, NP [address] [place],  [place] [address]  PRIMARY PROVIDER:  No primary care provider on file.  PRIMARY REASON FOR VISIT:  1. Family history of colon cancer   2. Family history of breast cancer      HISTORY OF PRESENT ILLNESS:  I connected with  Monique Richmond on 04/26/2020 at 9 AM EDT by MyChart video conference and verified that I am speaking with the correct person using two identifiers.   Patient location: Home Provider location: [facility]   Monique Richmond, a 30 y.o. female, was seen for a [place] cancer genetics consultation at the request of Dr. Dan Humphreys due to a family history of breast and colon cancer.  Monique Richmond presents to clinic today to discuss the possibility of a hereditary predisposition to cancer, genetic testing, and to further clarify her future cancer risks, as well as potential cancer risks for family members.   Monique Richmond is a 30 y.o. female with no personal history of cancer.  Several family members have tested positive for a familial ATM pathogenic mutation and she is wanting to undergo genetic testing in order to understand her risks for cancer.  CANCER HISTORY:  Oncology History   No history exists.     RISK FACTORS:  Menarche was at age 37.  First live birth at age N/A.  Ovaries intact: yes.  Hysterectomy: no.  Menopausal status: premenopausal.  HRT use: 0 years. Colonoscopy: no; not examined. Mammogram within the last year: no. Number of breast biopsies: 0. Up to date with pelvic exams: yes. Any excessive radiation exposure in the past: no  Past Medical History:  Diagnosis Date  . Family history of breast cancer   . Family history of colon cancer 04/26/2020  . Family history of colon cancer      Social History   Socioeconomic History  . Marital status: Not on file    Spouse name: Not on file  . Number of children: Not on file  . Years of education: Not on file  . Highest education level: Not on file   Occupational History  . Not on file  Tobacco Use  . Smoking status: Not on file  . Smokeless tobacco: Not on file  Substance and Sexual Activity  . Alcohol use: Not on file  . Drug use: Not on file  . Sexual activity: Not on file  Other Topics Concern  . Not on file  Social History Narrative  . Not on file   Social Determinants of Health   Financial Resource Strain: Not on file  Food Insecurity: Not on file  Transportation Needs: Not on file  Physical Activity: Not on file  Stress: Not on file  Social Connections: Not on file     FAMILY HISTORY:  We obtained a detailed, 4-generation family history.  Significant diagnoses are listed below: Family History  Problem Relation Age of Onset  . Colon cancer Father 7  . Lymphoma Father 37  . Other Father        ATM+  . Breast cancer Maternal Aunt   . Thyroid cancer Maternal Aunt   . Breast cancer Paternal Aunt 63       ATM+  . Dementia Maternal Grandmother   . Heart attack Paternal Grandfather   . Stroke Paternal Grandfather   . Other Half-Sister        ATM+  . Breast cancer Half-Sister 83  . Other Half-Sister  ATM+  . Breast cancer Paternal Aunt 74       ATM+  . Brain cancer Cousin 30       benign brain tumors    The patient does not have children.  She has three paternal half sisters, one who had an early stage breast cancer at age 74.  This sister, and one other, tested positive for a hereditary ATM pathogenic mutation.  The patient's parents are living.  The patient's father was diagnosed with colon cancer at age 52 and lymphoma at 49.  He has tested positive for the ATM mutation.  He has one brother and three sisters.  Two sisters were diagnosed with breast cancer and have the ATM mutation.  One sister does not have breast cancer but has a daughter with benign brain tumors.  The paternal grandmother is living and the grandfather died of a heart attack.  The patient's mother is living. She has four brothers,  three sisters and a maternal half sister.  One full sister was diagnosed with breast and thyroid cancer.  There is no other reported family history of cancer on this side of the family.   Monique Richmond is aware of previous family history of genetic testing for hereditary cancer risks. Patient's maternal ancestors are of [place] and [place] descent, and paternal ancestors are of [place], [place] and [place] descent. There is no reported Ashkenazi Jewish ancestry. There is no known consanguinity.    GENETIC COUNSELING ASSESSMENT: Monique Richmond is a 30 y.o. female with a family history of breast and colon cancer which is somewhat suggestive of a hereditary cancer syndrome and predisposition to cancer given the number of women in the family with breast cancer, young ages of onset and a known ATM pathogenic variant. We, therefore, discussed and recommended the following at today's visit.   DISCUSSION: We discussed that 5 - 10% of breast cancer is hereditary, with most cases associated with BRCA mutations.  There are other genes that can be associated with hereditary breast cancer syndromes.  These include ATM, CHEK2 and PALB2.  The patient reports a known familial mutation in ATM in her half sister, father, and two paternal aunts.  Based on her father's known pathogenic ATM mutation, Monique Richmond has a 50% chance of inheriting this mutation.  At that time, she would be placed at an increased risk for breast and possibly pancreatic cancer.  At this time, colon cancer, which her father has, is not a typical cancer seen in families with ATM.  Her father only had testing for the familial gene mutation, and not for the cancer he personally had.  Therefore, there is a small chance that his colon cancer could be the result of another gene mutation.  We discussed that testing is beneficial for several reasons including knowing how to follow individuals and understand if other family members could be at risk for cancer and allow them to  undergo genetic testing.   We reviewed the characteristics, features and inheritance patterns of hereditary cancer syndromes. We also discussed genetic testing, including the appropriate family members to test, the process of testing, insurance coverage and turn-around-time for results. We discussed the implications of a negative, positive and/or variant of uncertain significant result. In order to get genetic test results in a timely manner so that Monique Richmond can use these genetic test results for surgical decisions, we recommended Monique Richmond pursue genetic testing for the familial ATM mutation. Once complete, we recommend Monique Richmond pursue reflex genetic testing  to the common hereditary cancer gene panel. The Common Hereditary Gene Panel offered by [facility] includes sequencing and/or deletion duplication testing of the following 48 genes: APC, ATM, AXIN2, BARD1, BMPR1A, BRCA1, BRCA2, BRIP1, CDH1, CDK4, CDKN2A (p14ARF), CDKN2A (p16INK4a), CHEK2, CTNNA1, DICER1, EPCAM (Deletion/duplication testing only), GREM1 (promoter region deletion/duplication testing only), KIT, MEN1, MLH1, MSH2, MSH3, MSH6, MUTYH, NBN, NF1, NHTL1, PALB2, PDGFRA, PMS2, POLD1, POLE, PTEN, RAD50, RAD51C, RAD51D, RNF43, SDHB, SDHC, SDHD, SMAD4, SMARCA4. STK11, TP53, TSC1, TSC2, and VHL.  The following genes were evaluated for sequence changes only: SDHA and HOXB13 c.251G>A variant only.   Based on Monique Richmond's family history of cancer, she meets medical criteria for genetic testing. Despite that she meets criteria, she may still have an out of pocket cost.   PLAN: After considering the risks, benefits, and limitations, Monique Richmond provided informed consent to pursue genetic testing.  A saliva kit was ordered and will be sent to her address.  Monique Richmond will be responsible for completing the testing and sending the sample back to [facility] for analysis of the Common hereditary cancer panel. Results should be available within  approximately 2-3 weeks' time, at which point they will be disclosed by telephone to Monique Richmond, as will any additional recommendations warranted by these results. Monique Richmond will receive a summary of her genetic counseling visit and a copy of her results once available. This information will also be available in Epic.   Lastly, we encouraged Monique Richmond to remain in contact with cancer genetics annually so that we can continuously update the family history and inform her of any changes in cancer genetics and testing that may be of benefit for this family.   Ms. Ellingham questions were answered to her satisfaction today. Our contact information was provided should additional questions or concerns arise. Thank you for the referral and allowing [place] to share in the care of your patient.   Caysen Whang P. Lowell Guitar, MS, [facility] Licensed, Patent attorney [email or web address] phone: [phone]  The patient was seen for a total of 45 minutes in face-to-face genetic counseling.  This patient was discussed with Drs. Magrinat, Pamelia Hoit and/or Mosetta Putt who agrees with the above.    _______________________________________________________________________ For Office Staff:  Number of people involved in session: 1 Was an Intern/ student involved with case: no

## 2020-05-12 ENCOUNTER — Encounter: Payer: Self-pay | Admitting: Genetic Counselor

## 2020-05-12 DIAGNOSIS — Z1379 Encounter for other screening for genetic and chromosomal anomalies: Secondary | ICD-10-CM | POA: Insufficient documentation

## 2020-05-14 ENCOUNTER — Telehealth: Payer: Self-pay | Admitting: Genetic Counselor

## 2020-05-14 NOTE — Telephone Encounter (Signed)
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  We will talk on Wed, 05/19/2020 and discuss how to manage her care.

## 2020-05-19 ENCOUNTER — Encounter: Payer: Self-pay | Admitting: Genetic Counselor

## 2020-05-19 ENCOUNTER — Ambulatory Visit (HOSPITAL_BASED_OUTPATIENT_CLINIC_OR_DEPARTMENT_OTHER): Payer: BC Managed Care – PPO | Admitting: Genetic Counselor

## 2020-05-19 DIAGNOSIS — Z1379 Encounter for other screening for genetic and chromosomal anomalies: Secondary | ICD-10-CM | POA: Diagnosis not present

## 2020-05-19 DIAGNOSIS — Z803 Family history of malignant neoplasm of breast: Secondary | ICD-10-CM | POA: Diagnosis not present

## 2020-05-19 NOTE — Progress Notes (Signed)
GENETIC TEST RESULTS   Patient Name: Monique Richmond Patient Age: 30 y.o. Encounter Date: 05/19/2020  Referring Provider: Ronny Bacon, NP    I connected with  Monique Richmond on 05/19/2020 at 9 AM EDT by [place] video conference and verified that I am speaking with the correct person using two identifiers.   Patient location: Home Provider location: [facility]   Monique Richmond was seen in the [place] clinic on May 19, 2020 due to a family history of cancer and concern regarding a hereditary predisposition to cancer in the family. Please refer to the prior Genetics clinic note for more information regarding Monique Richmond's medical and family histories and our assessment at the time.   FAMILY HISTORY:  We obtained a detailed, 4-generation family history.  Significant diagnoses are listed below: Family History  Problem Relation Age of Onset  . Colon cancer Father 14  . Lymphoma Father 62  . Other Father        ATM+  . Breast cancer Maternal Aunt   . Thyroid cancer Maternal Aunt   . Breast cancer Paternal Aunt 45       ATM+  . Dementia Maternal Grandmother   . Heart attack Paternal Grandfather   . Stroke Paternal Grandfather   . Other Half-Sister        ATM+  . Breast cancer Half-Sister 62  . Other Half-Sister        ATM+  . Breast cancer Paternal Aunt 52       ATM+  . Brain cancer Cousin 30       benign brain tumors    The patient does not have children.  She has three paternal half sisters, one who had an early stage breast cancer at age 41.  This sister, and one other, tested positive for a hereditary ATM pathogenic mutation.  The patient's parents are living.  The patient's father was diagnosed with colon cancer at age 47 and lymphoma at 65.  He has tested positive for the ATM mutation.  He has one brother and three sisters.  Two sisters were diagnosed with breast cancer and have the ATM mutation.  One sister does not have breast cancer but has a daughter with benign  brain tumors.  The paternal grandmother is living and the grandfather died of a heart attack.  The patient's mother is living. She has four brothers, three sisters and a maternal half sister.  One full sister was diagnosed with breast and thyroid cancer.  There is no other reported family history of cancer on this side of the family.   Monique Richmond is aware of previous family history of genetic testing for hereditary cancer risks. Patient's maternal ancestors are of [place] and [place] descent, and paternal ancestors are of [place], [place] and [place] descent. There is no reported Ashkenazi Jewish ancestry. There is no known consanguinity.     GENETIC TESTING: At the time of Monique Richmond's visit, we recommended she pursue genetic testing for the specific ATM mutation that was identified in the family, through testing using the common hereditary cancer panel. 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     DISCUSSION: The ATM gene is involved in the detection and surveillance of DNA damage.  ATM phosphorylation of BRCA1 is critical for proper response to DNA double-strand breaks.  This is believed to be the reason for  the role ATM has in breast cancer risk.  Individuals with a ATM mutation are at a greater risk for having children with Ataxia-telangiectasia (A-T).  AT is characterized by progressive cerebellar degeneration (ataxia), dilated blood vessels in the eyes and skin (telangiectasia), immunodeficiency, chromosomal instability, increased sensitivity to ionizing radiation and a predisposition to lymphoma and leukemia.  Therefore, individuals of childbearing age who have a known ATM mutation may want to consider having their spouse tested to determine their risk for having a child with A-T.  Studies of these families demonstrated increased incident of  breast cancer in the mothers (who are heterozygous carriers) of the affected children, thus prompting further evaluation of the relationship between breast cancer and ATM.  Women who are heterozygous ATM carriers have an increase breast cancer risk.  They have 5-fold increased risk of breast cancer <50 years, and 2-3 fold increased risk for breast cancer overall.  In families with familial breast cancer that were negative for BRCA1 or BRCA2 genes, approximately 2.7% of women were found to have one ATM mutation.  In families with both breast cancer and leukemia, 6.7% of women were found to have one ATM mutation.  There has been some evidence that radiation treatment may increase the risk for breast cancer in the contralateral breast. Despite this risk, we do not recommend declining radiation treatment for her breast cancer if it is recommended, as the risk for having a recurrence of breast cancer based on not going through radiation may be greater than her risk for getting breast cancer again from the radiation.   Management for individuals with ATM mutations can be found in the NCCN guidelines (v.1.2022).  These guidelines recommend the following:  Breast Cancer     Screening: Annual mammogram with consideration of tomosynthesis and consider breast MRI with contrast starting at age 19 years.  Risk Reducing Mastectomy: Evidence insufficient, consider based on family history  Ovarian Cancer  Possible increased risk for ovarian cancer, however, there is insufficient evidence to recommend BSO.  We recommend to follow based on family history.  Other Cancer Risks  Unknown or insufficient evidence for pancreatic or prostate cancer risk  RISK ASSESSMENT: Based on the patient's family history, a statistical model (Tyrer Cusik) was used to estimate her risk of developing breast cancer. This estimates her lifetime risk of developing breast cancer to be approximately 31.1%.  The patient's lifetime breast  cancer risk is a preliminary estimate based on available information using one of several models endorsed by the [place] (ACS). The ACS recommends consideration of breast MRI screening as an adjunct to mammography for patients at high risk (defined as 20% or greater lifetime risk). Please note that a woman's breast cancer risk changes over time. It may increase or decrease based on age and any changes to the personal and/or family medical history. The risks and recommendations listed above apply to this patient at this point in time. In the future, she may or may not be eligible for the same medical management strategies and, in some cases, other medical management strategies may become available to her. If she is interested in an updated breast cancer risk assessment at a later date, she can contact us.  Ms. Taussig has been determined to be at high risk for breast cancer.  Therefore, we recommend that annual screening with mammography and breast MRI be performed begin at age 21, or 10 years prior to the age of breast cancer diagnosis in a relative (whichever is earlier).  We discussed that Ms. Chatham should discuss her individual situation with her referring physician and determine a breast cancer screening plan with which they are both comfortable.  We will refer her to the High Risk breast clinic at [facility] in order to get more information about high risk breast screening.  They can make referrals within the community for mammograms and MRI.    FAMILY MEMBERS: It is important that all of Ms. Mcinroy's relatives (both men and women) know of the presence of this gene mutation. Site-specific genetic testing can sort out who in the family is at risk and who is not.   Ms. Tamplin siblings have a 50% chance to have inherited this mutation. We recommend they have genetic testing for this same mutation, as identifying the presence of this mutation would allow them to also take advantage of risk-reducing  measures.   SUPPORT AND RESOURCES: If Ms. Gaunt is interested in ATM-specific information and support, there are two groups, Facing Our Risk (www.facingourrisk.com) and Bright Pink (www.brightpink.org) which some people have found useful. They provide opportunities to speak with other individuals from high-risk families. To locate genetic counselors in other cities, visit the website of the [employer] (ArtistMovie.se) and Secretary/administrator for a Social worker by zip code.  We encouraged Ms. Manolis to remain in contact with [place] on an annual basis so we can update her personal and family histories, and let her know of advances in cancer genetics that may benefit the family. Our contact number was provided. Ms. Mayol questions were answered to her satisfaction today, and she knows she is welcome to call anytime with additional questions.   Emon Miggins P. Florene Glen, [place], [facility] Licensed, Insurance risk surveyor [email or web address] phone: [phone]  The patient was seen for a total of 35 minutes in face-to-face genetic counseling.

## 2020-05-27 ENCOUNTER — Telehealth: Payer: Self-pay | Admitting: Genetic Counselor

## 2020-05-27 NOTE — Telephone Encounter (Signed)
Pt has been cld on 2/3 and 2/10 to schedule an appt for her to be seen in the high risk breast clinic. Pt has n't returned any of my phone calls as of today.

## 2020-06-01 ENCOUNTER — Encounter (INDEPENDENT_AMBULATORY_CARE_PROVIDER_SITE_OTHER): Payer: Self-pay

## 2020-06-04 ENCOUNTER — Telehealth: Payer: Self-pay | Admitting: Hematology and Oncology

## 2020-06-04 NOTE — Telephone Encounter (Signed)
Ms. Nicolson returned my call to be scheduled in the high risk breast clinic on 3/17 at 1pm to see Dr. Al Pimple. Pt aware to arrive 20 minutes early.

## 2020-07-01 ENCOUNTER — Telehealth: Payer: Self-pay | Admitting: Hematology and Oncology

## 2020-07-01 ENCOUNTER — Encounter: Payer: Self-pay | Admitting: Hematology and Oncology

## 2020-07-01 ENCOUNTER — Inpatient Hospital Stay: Payer: BC Managed Care – PPO | Attending: Hematology and Oncology | Admitting: Hematology and Oncology

## 2020-07-01 ENCOUNTER — Other Ambulatory Visit: Payer: Self-pay

## 2020-07-01 VITALS — BP 105/77 | HR 79 | Temp 97.8°F | Resp 16 | Ht 63.0 in | Wt 210.0 lb

## 2020-07-01 DIAGNOSIS — Z1509 Genetic susceptibility to other malignant neoplasm: Secondary | ICD-10-CM | POA: Diagnosis present

## 2020-07-01 DIAGNOSIS — Z1501 Genetic susceptibility to malignant neoplasm of breast: Secondary | ICD-10-CM | POA: Insufficient documentation

## 2020-07-01 DIAGNOSIS — Z1589 Genetic susceptibility to other disease: Secondary | ICD-10-CM | POA: Diagnosis not present

## 2020-07-01 DIAGNOSIS — Z1502 Genetic susceptibility to malignant neoplasm of ovary: Secondary | ICD-10-CM | POA: Insufficient documentation

## 2020-07-01 NOTE — Progress Notes (Signed)
[place] CONSULT NOTE  No care team member to display  CHIEF COMPLAINTS/PURPOSE OF CONSULTATION:   Heterozygous for ATM mutation  ASSESSMENT & PLAN:  No problem-specific Assessment & Plan notes found for this encounter.  No orders of the defined types were placed in this encounter.  ATM heterozygous gene mutation:   It is estimated that approximately 3 percent of White people in the [place] are ATM heterozygotes I discussed with the patient that having ATM gene mutation leads to 2-3 times increased risk of breast cancer which translates into a 30% lifetime risk of breast cancer. In two large cohort studies, it was thought that ER positive breast cancer is common than ER negative breast cancer among ATM carriers.  Intervention  For those with pathogenic variants in ATM, we typically initiate annual mammography with tomography and annual MRI, starting at age 5 years, given evidence of moderately increased lifetime risk of breast cancer.  The evidence is insufficient to uniformly recommend RRM, although for those with a concerning family history (>20 percent risk of breast cancer by a model), it may be reasonable for carriers to consider this option. We have discussed briefly about surgery and patient is not inclined at this time to consider RRM. She is willing to proceed with MRI and mammograms annually.  Pathogenic variants in ATM do not appear to confer a significantly increased risk for ovarian cancer. For carriers who have a family history of ovarian cancer, we discuss the potential risks and benefits of rrBSO. She has no family history of ovarian cancer, hence there is no clear role of rr BSO.  For ATM carriers with a family history of pancreatic cancer, pancreatic cancer screening is offered with annual MRCP or EUS. Again, she has no FH of pancreatic cancer, so at this time, no additional screening is recommended.  Plan  Mammogram and MRI  ordered. Encouraged self breast exam and return to clinic in 6 months for physical exam. She will benefit from regular exercise, healthy weight, limiting alcohol intake. She has already met our Dietitian,   HISTORY OF PRESENTING ILLNESS:   Monique Richmond 30 y.o. female is here because of ATM mutation  Monique Richmond is here for an initial visit. She is a healthy person overall. She denies any health complaints except for some psych issues for which she follows up with some on in pscyh. Its not clear if this is NP or MD. She proceeded with a ATM testing because her father had prostate cancer and tested positive for the ATM gene. Her sister had pre cancer in her breast at the age of 63 and elected to proceed with bilateral mastectomy. She has no complaints at all for me today.  Review of systems completely unremarkable  REVIEW OF SYSTEMS:   Constitutional: Denies fevers, chills or abnormal night sweats Eyes: Denies blurriness of vision, double vision or watery eyes Ears, nose, mouth, throat, and face: Denies mucositis or sore throat Respiratory: Denies cough, dyspnea or wheezes Cardiovascular: Denies palpitation, chest discomfort or lower extremity swelling Gastrointestinal:  Denies nausea, heartburn or change in bowel habits Skin: Denies abnormal skin rashes Lymphatics: Denies new lymphadenopathy or easy bruising Neurological:Denies numbness, tingling or new weaknesses Behavioral/Psych: Mood is stable, no new changes  All other systems were reviewed with the patient and are negative.  MEDICAL HISTORY:  Past Medical History:  Diagnosis Date  . Family history of breast cancer   . Family history of colon cancer 04/26/2020  . Family  history of colon cancer     SURGICAL HISTORY: No significant PSH  SOCIAL HISTORY: Social History   Socioeconomic History  . Marital status: Not on file    Spouse name: Not on file  . Number of children: Not on file  . Years of education: Not on  file  . Highest education level: Not on file  Occupational History  . Not on file  Tobacco Use  . Smoking status: Not on file  . Smokeless tobacco: Not on file  Substance and Sexual Activity  . Alcohol use: Not on file  . Drug use: Not on file  . Sexual activity: Not on file  Other Topics Concern  . Not on file  Social History Narrative  . Not on file   Social Determinants of Health   Financial Resource Strain: Not on file  Food Insecurity: Not on file  Transportation Needs: Not on file  Physical Activity: Not on file  Stress: Not on file  Social Connections: Not on file  Intimate Partner Violence: Not on file    FAMILY HISTORY: Family History  Problem Relation Age of Onset  . Colon cancer Father 36  . Lymphoma Father 55  . Other Father        ATM+  . Breast cancer Maternal Aunt   . Thyroid cancer Maternal Aunt   . Breast cancer Paternal Aunt 44       ATM+  . Dementia Maternal Grandmother   . Heart attack Paternal Grandfather   . Stroke Paternal Grandfather   . Other Half-Sister        ATM+  . Breast cancer Half-Sister 101  . Other Half-Sister        ATM+  . Breast cancer Paternal Aunt 6       ATM+  . Brain cancer Cousin 30       benign brain tumors    ALLERGIES:  has no allergies on file.  MEDICATIONS:  No current outpatient medications on file.   No current facility-administered medications for this visit.     PHYSICAL EXAMINATION: ECOG PERFORMANCE STATUS: 0 - Asymptomatic  There were no vitals filed for this visit. There were no vitals filed for this visit.  GENERAL:alert, no distress and comfortable LYMPH:  no palpable lymphadenopathy in the cervical, axillary PSYCH: alert & oriented x 3 with fluent speech NEURO: no focal motor/sensory deficits Breast: Bilateral breasts normal to inspection and palpation.  No regional lymphadenopathy.   LABORATORY DATA:  I have reviewed the data as listed No results found for: WBC, HGB, HCT, MCV, PLT    Chemistry   No results found for: NA, K, CL, CO2, BUN, CREATININE, GLU No results found for: CALCIUM, ALKPHOS, AST, ALT, BILITOT    RADIOGRAPHIC STUDIES: I have personally reviewed the radiological images as listed and agreed with the findings in the report. No results found.  All questions were answered. The patient knows to call the clinic with any problems, questions or concerns. I spent 45 minutes in the care of this patient including H and P, review of records, counseling and coordination of care.     Benay Pike, MD 07/01/2020 12:16 PM

## 2020-07-01 NOTE — Telephone Encounter (Signed)
Scheduled follow-up appointment per 3/17 los. Patient is aware. ?

## 2020-11-11 ENCOUNTER — Ambulatory Visit
Admission: RE | Admit: 2020-11-11 | Discharge: 2020-11-11 | Disposition: A | Discharge status: Home / Self Care | Payer: BC Managed Care – PPO | Source: Ambulatory Visit | Attending: Hematology and Oncology | Admitting: Hematology and Oncology

## 2020-11-11 ENCOUNTER — Other Ambulatory Visit: Payer: Self-pay

## 2020-11-11 DIAGNOSIS — Z1501 Genetic susceptibility to malignant neoplasm of breast: Secondary | ICD-10-CM

## 2020-11-11 IMAGING — MG MM DIGITAL SCREENING BILAT W/ TOMO AND CAD
8 series · 8 of 24 positions shown · non-contrast
Comparison: None.

CLINICAL DATA: Screening.

EXAM:
DIGITAL SCREENING BILATERAL MAMMOGRAM WITH TOMOSYNTHESIS AND CAD
TECHNIQUE: Bilateral screening digital craniocaudal and mediolateral oblique
mammograms were obtained. Bilateral screening digital breast
tomosynthesis was performed. The images were evaluated with
computer-aided detection.

[L MLO synth-2D]
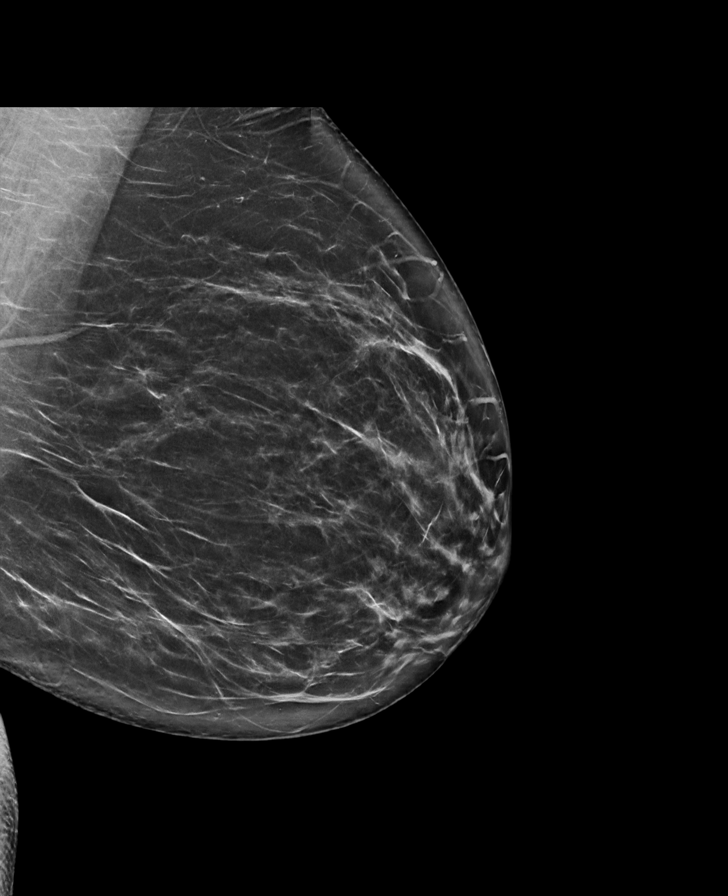

[L CC synth-2D]
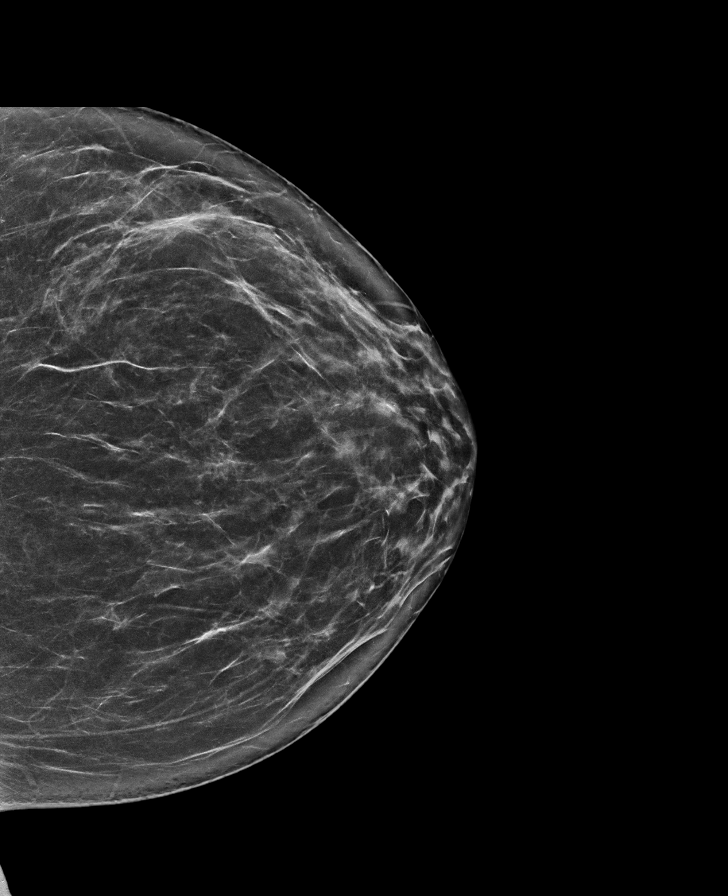

[R CC synth-2D]
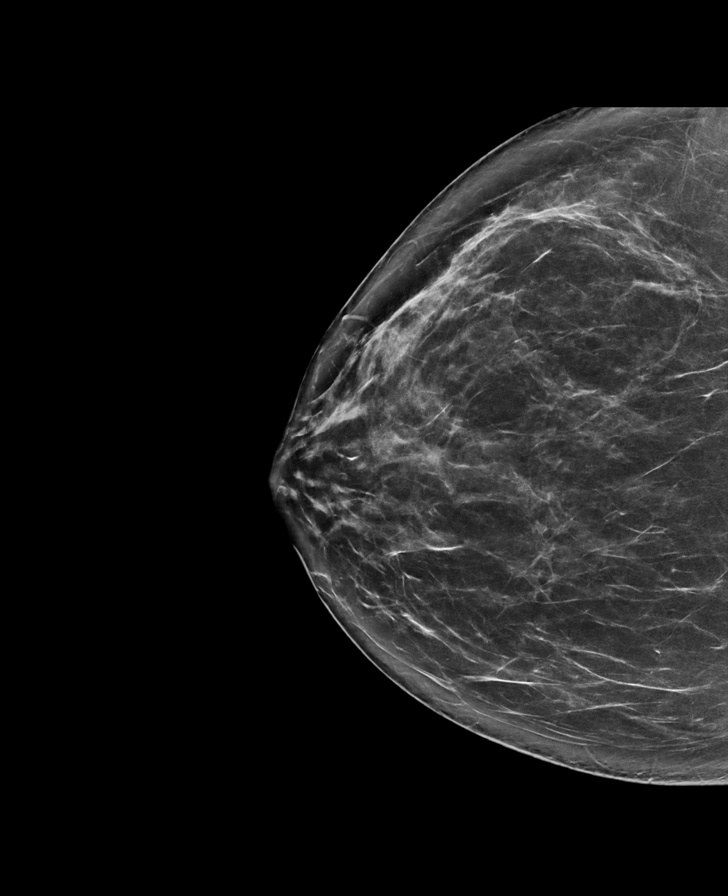

[R MLO synth-2D]
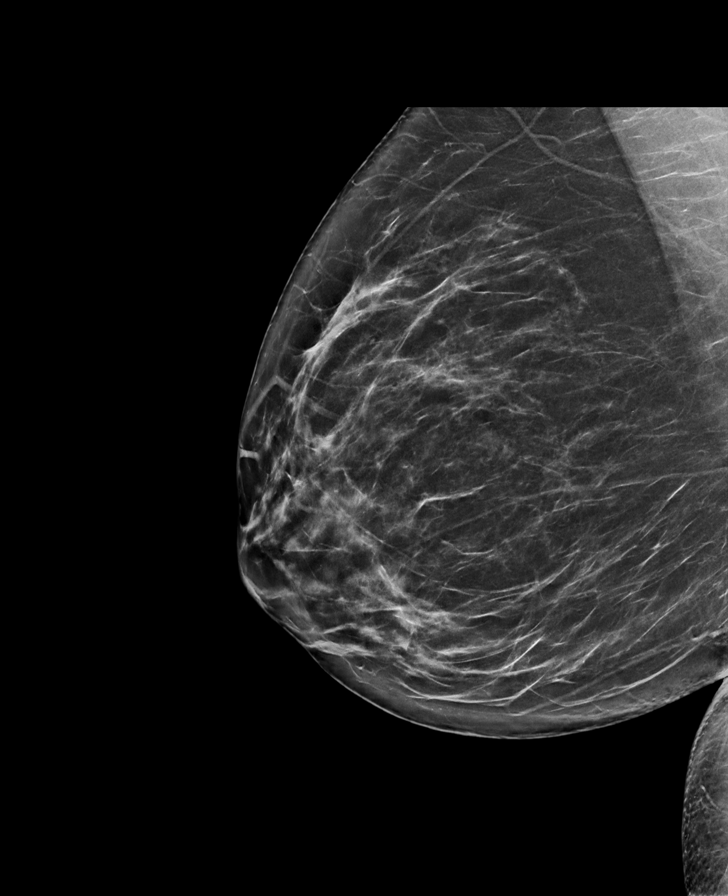

[R CC tomo · tomo slice 41/82.0]
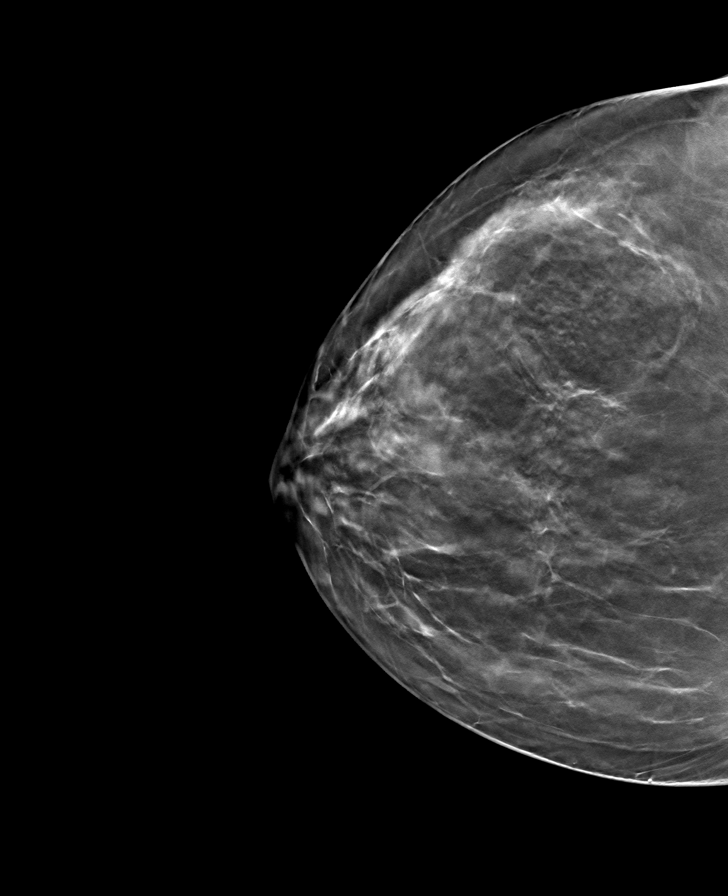

[L CC tomo · tomo slice 39/77.0]
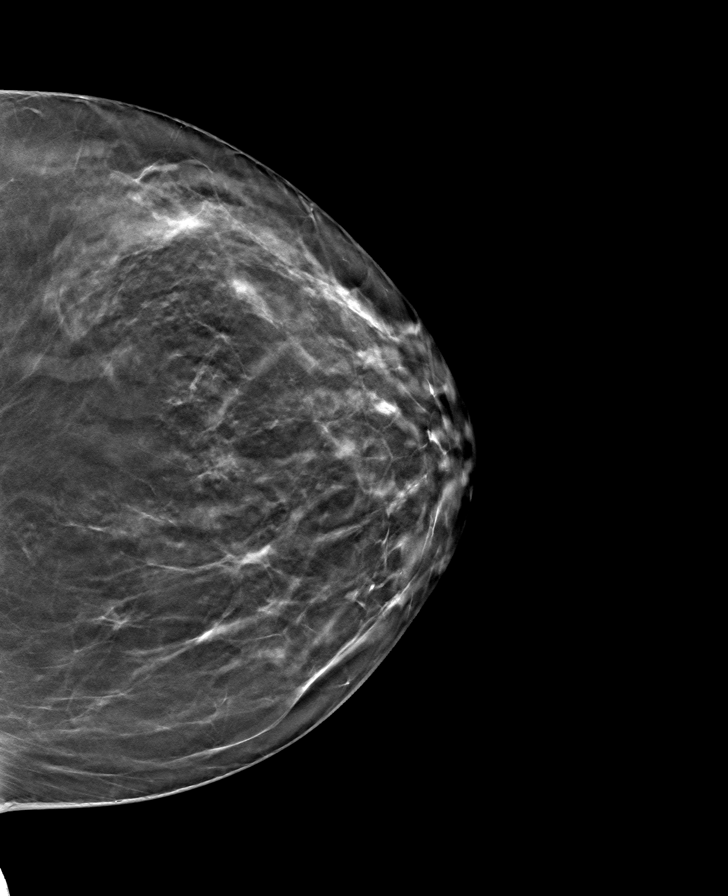

[L MLO tomo · tomo slice 43/86.0]
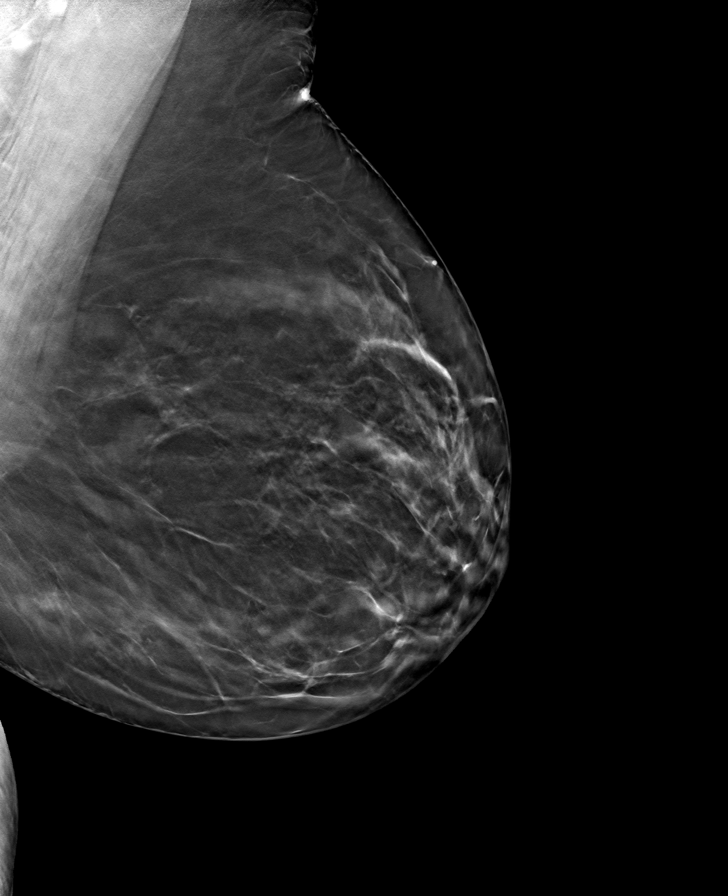

[R MLO tomo · tomo slice 43/85.0]
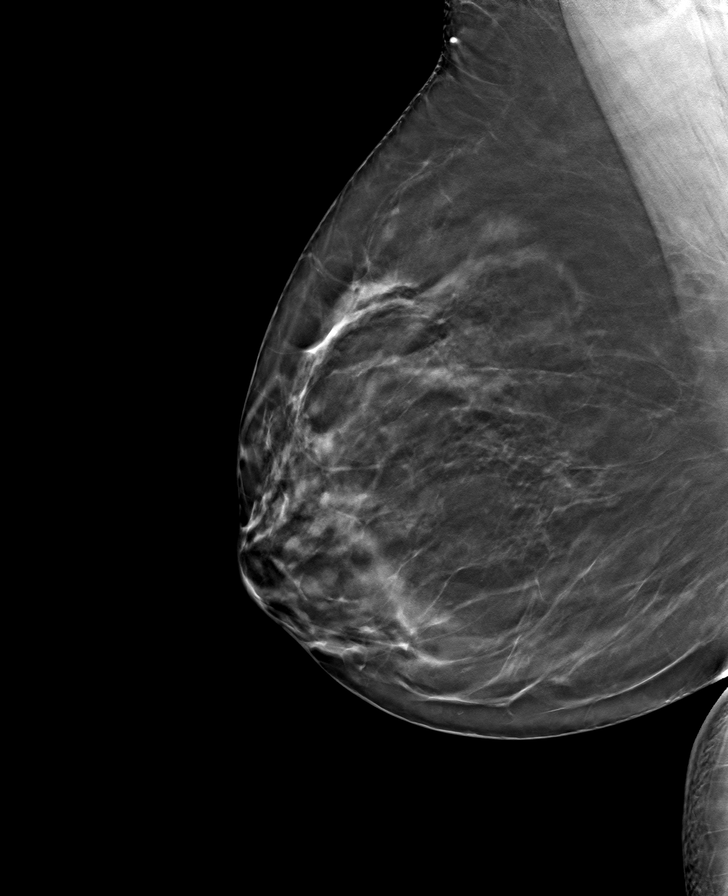

[8 of 24 positions shown; findings below may reference images not displayed]

ACR Breast Density Category b: There are scattered areas of
fibroglandular density.
FINDINGS: There are no findings suspicious for malignancy.
IMPRESSION: No mammographic evidence of malignancy. A result letter of this
screening mammogram will be mailed directly to the patient.

RECOMMENDATION:
Due increased breast cancer risk with known AT M mutation, recommend
screening mammogram in one year.(Code:[1F]). Also consider
annual high risk screening breast MRI.

BI-RADS CATEGORY  1: Negative.

## 2020-12-12 ENCOUNTER — Ambulatory Visit
Admission: RE | Admit: 2020-12-12 | Discharge: 2020-12-12 | Disposition: A | Discharge status: Home / Self Care | Payer: BC Managed Care – PPO | Source: Ambulatory Visit | Attending: Hematology and Oncology | Admitting: Hematology and Oncology

## 2020-12-12 DIAGNOSIS — Z1501 Genetic susceptibility to malignant neoplasm of breast: Secondary | ICD-10-CM

## 2020-12-12 DIAGNOSIS — Z1589 Genetic susceptibility to other disease: Secondary | ICD-10-CM

## 2020-12-12 IMAGING — MR MR BREAST BILAT WO/W CM
8 of 13 series · 34 of 48 positions shown · IV contrast (gadavist)
Comparison: Screening mammography [DATE]

CLINICAL DATA: Family history of breast cancer. The patient's aunt
had breast cancer in her 50s. The patient's sister had breast cancer
at the age of 39.

LABS:  None
EXAM:
BILATERAL BREAST MRI WITH AND WITHOUT CONTRAST
TECHNIQUE: Multiplanar, multisequence MR images of both breasts were obtained
prior to and following the intravenous administration of 10 ml of
Gadavist

[Series 2: t2_tirm_tra ipat (a-p) · axial · 1.2mm · 0.89mm/px · z∈[-110,+61]mm · 4 of 144 slices shown]
[im 1/144]
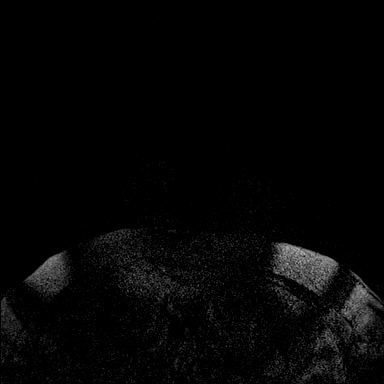
[im 48/144]
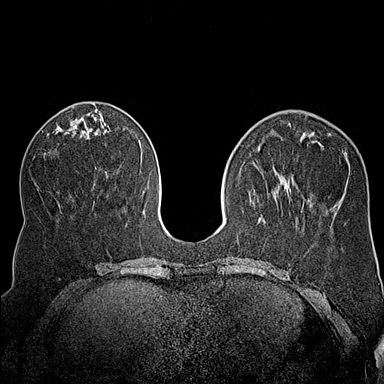
[im 96/144]
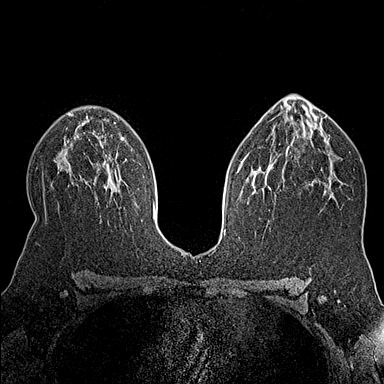
[im 144/144]
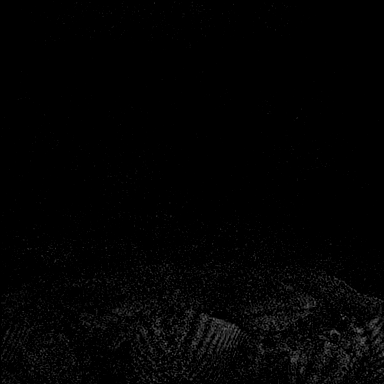

[Series 3: fl3d pre-cm no · axial · non-contrast · 1.2mm · 0.89mm/px · z∈[-110,+61]mm · 4 of 144 slices shown]
[im 1/144]
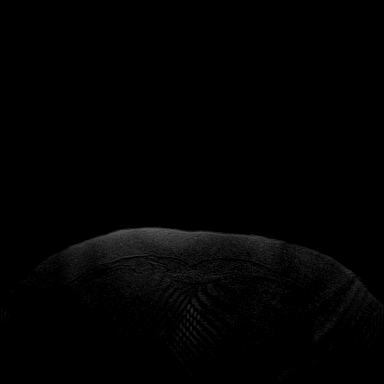
[im 48/144]
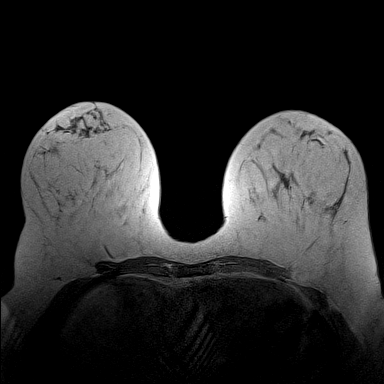
[im 96/144]
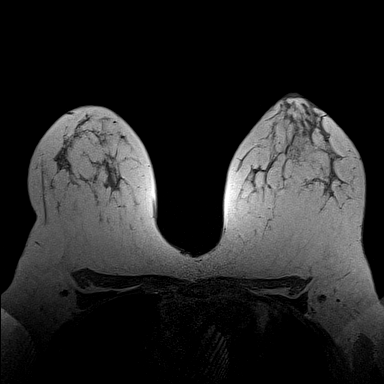
[im 144/144]
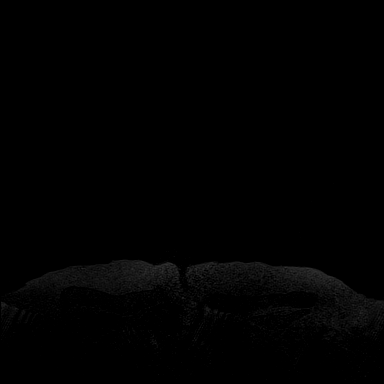

[Series 4: fl3d pre-cm · axial · non-contrast · 1.2mm · 0.89mm/px · z∈[-110,+61]mm · 5 of 144 slices shown]
[im 1/144]
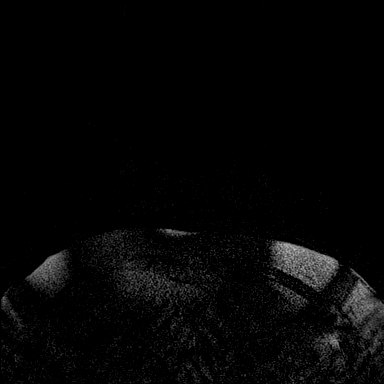
[im 36/144]
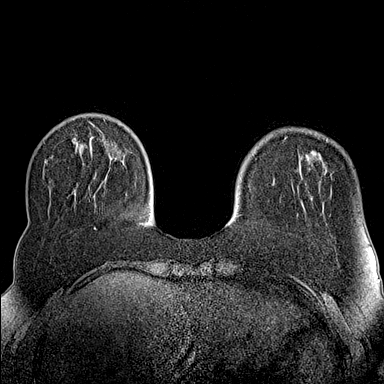
[im 72/144]
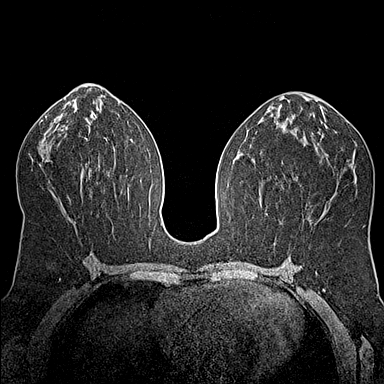
[im 108/144]
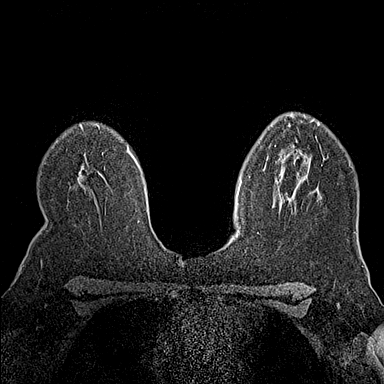
[im 144/144]
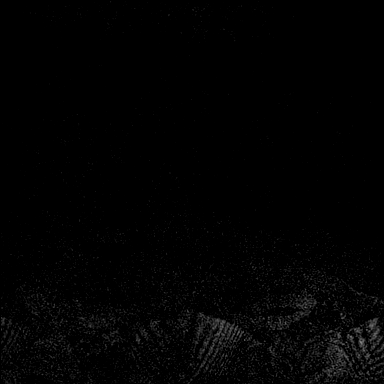

[Series 5: fl3d post-cm 20 · axial · 1.2mm · 0.89mm/px · z∈[-110,+61]mm · 5 of 144 slices shown (1 of 3)]
[im 1/144]
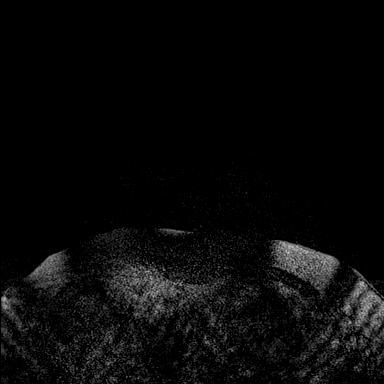
[im 36/144]
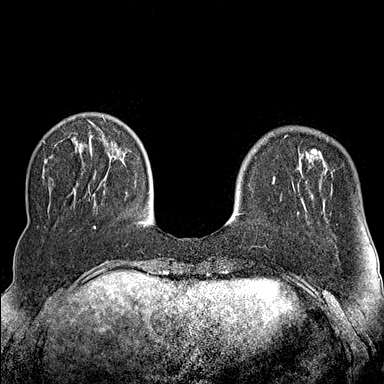
[im 72/144]
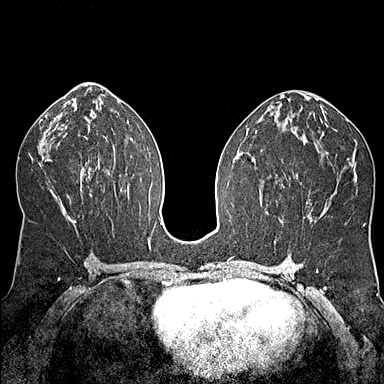
[im 108/144]
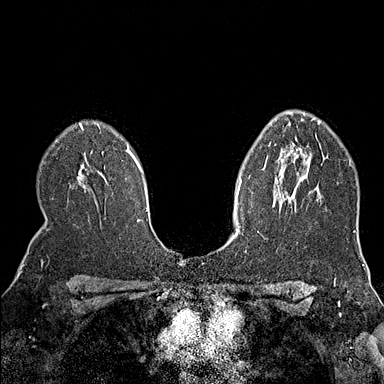
[im 144/144]
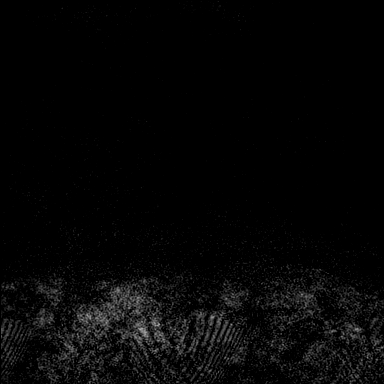

[Series 6: fl3d post-cm 20 · axial · 1.2mm · 0.89mm/px · z∈[-110,+61]mm · 5 of 144 slices shown (2 of 3)]
[im 1/144]
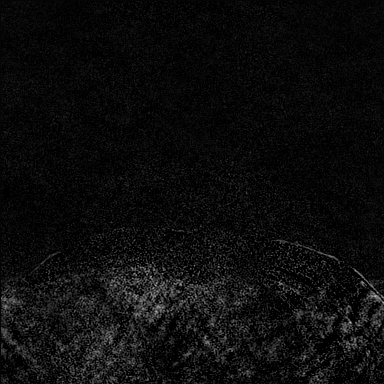
[im 36/144]
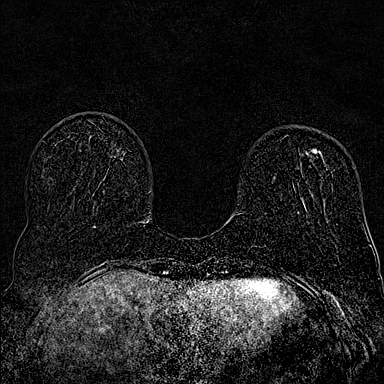
[im 72/144]
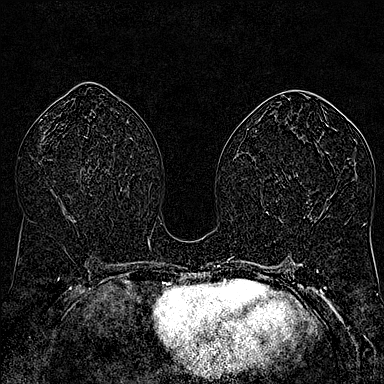
[im 108/144]
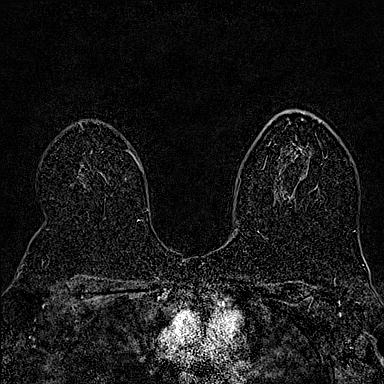
[im 144/144]
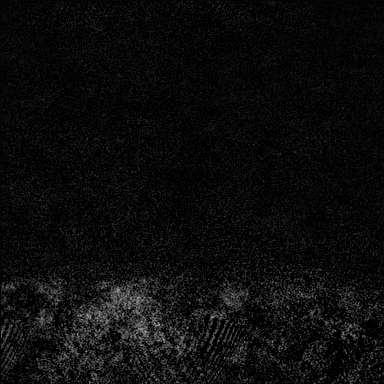

[Series 7: fl3d post-cm 20 · axial · 172.8mm · 0.89mm/px · 1 of 1 slices shown (3 of 3)]
[im 1/1]
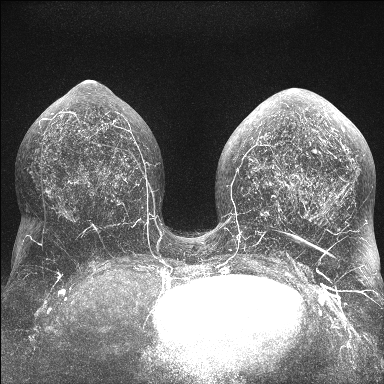

[Series 8: fl3d post-cm 3 · axial · 1.2mm · 0.89mm/px · z∈[-110,+61]mm · 5 of 144 slices shown (1 of 2)]
[im 1/144]
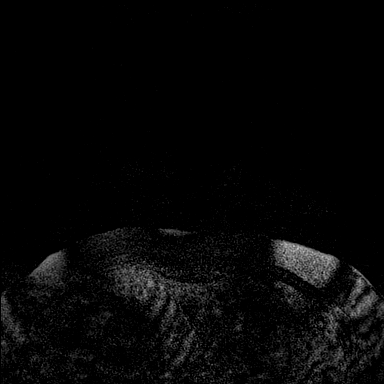
[im 36/144]
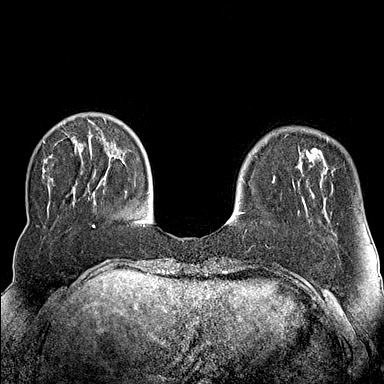
[im 72/144]
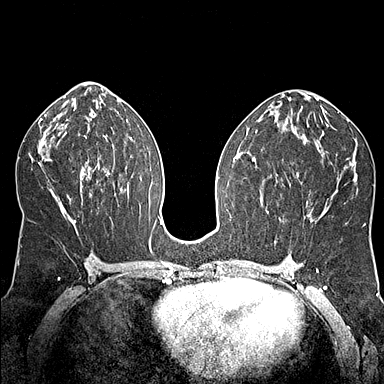
[im 108/144]
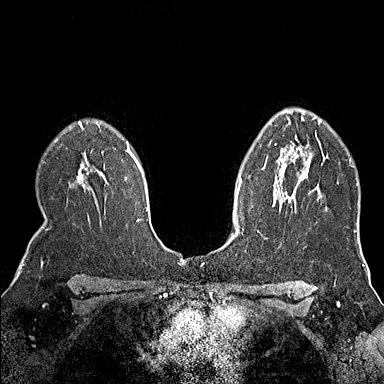
[im 144/144]
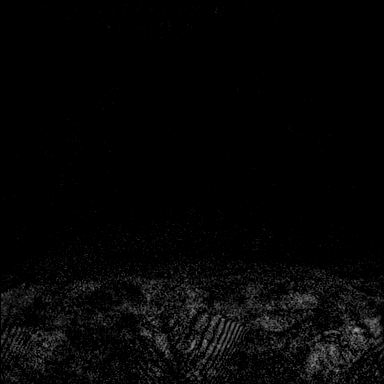

[Series 9: fl3d post-cm 3 · axial · 1.2mm · 0.89mm/px · z∈[-110,+61]mm · 5 of 144 slices shown (2 of 2)]
[im 1/144]
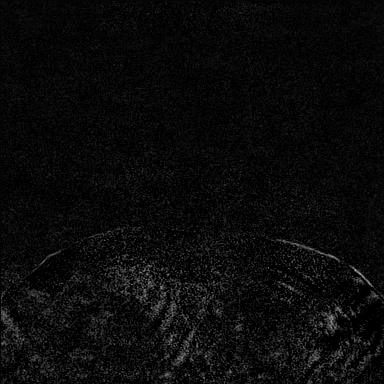
[im 36/144]
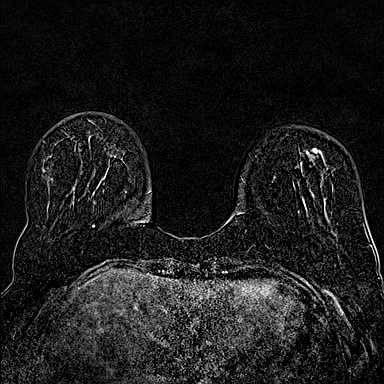
[im 72/144]
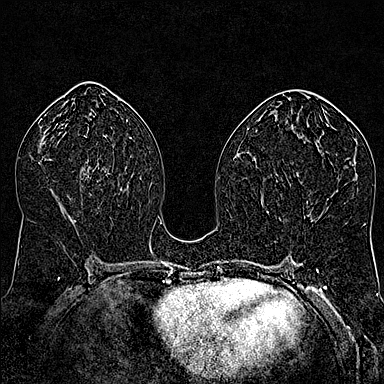
[im 108/144]
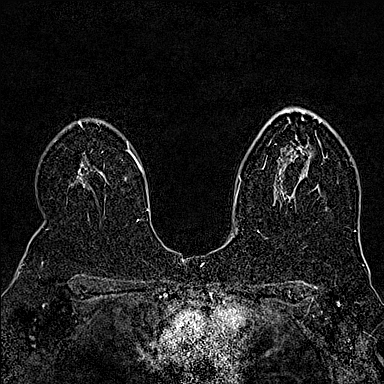
[im 144/144]
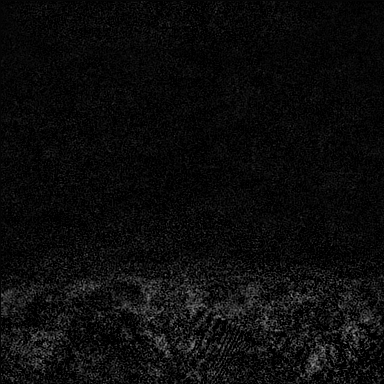

[34 of 48 positions shown; findings below may reference images not displayed]

Three-dimensional MR images were rendered by post-processing of the
original MR data on an independent workstation. The
three-dimensional MR images were interpreted, and findings are
reported in the following complete MRI report for this study. Three
dimensional images were evaluated at the independent interpreting
workstation using the DynaCAD thin client.
FINDINGS: Breast composition: b. Scattered fibroglandular tissue.

Background parenchymal enhancement: Moderate.

Right breast: No mass or abnormal enhancement.

Left breast: There are 2 masses in the left breast. The first is
located at an anterior depth at approximately 5 o'clock as seen on
series 12, image 69 measuring 5 mm. This mass demonstrates
persistent enhancement kinetics. More inferiorly in the breast is
another mass as seen on series 12, image 109. This mass also
measures 5 mm with a mixture of persistent and plateau kinetics. No
other suspicious findings are seen in the left breast.

Lymph nodes: No abnormal appearing lymph nodes.

Ancillary findings:  None.
IMPRESSION: Two indeterminate masses in the left breast as above. No suspicious
findings on the right.

RECOMMENDATION:
Recommend MRI guided biopsy of the 2 indeterminate left breast
masses.

BI-RADS CATEGORY  4: Suspicious.

## 2020-12-12 MED ORDER — GADOBUTROL 1 MMOL/ML IV SOLN
10.0000 mL | Freq: Once | INTRAVENOUS | Status: AC | PRN
  Administered 2020-12-12: 10 mL via INTRAVENOUS

## 2020-12-15 ENCOUNTER — Other Ambulatory Visit: Payer: Self-pay | Admitting: Hematology and Oncology

## 2020-12-15 DIAGNOSIS — R9389 Abnormal findings on diagnostic imaging of other specified body structures: Secondary | ICD-10-CM

## 2020-12-15 NOTE — Progress Notes (Signed)
[place] CONSULT NOTE  Patient Care Team: Pcp, No as PCP - General  CHIEF COMPLAINTS/PURPOSE OF CONSULTATION:   Heterozygous for ATM mutation  ASSESSMENT & PLAN:  This is a very pleasant 30 yr old female patient with ATM heterozygous mutation referred to high risk breast clinic for recommendations. During her last visit, we discussed about mammogram and MRI given her life time risk of breast cancer. She underwent breast MRI which showed two indeterminate masses in the left breast. No suspicious findings on the right. MR guided biopsy has been ordered. She was offered last week of September for biopsy but she would like to do it in first week of Oct if possible. She will RTC with [place] in second week of October with biopsy results She again wondered if she should go ahead with Bilateral mastectomy, discussed this is not standard of care with ATM carrier, we could look at the biopsy results and discuss treatment recommendations.  HISTORY OF PRESENTING ILLNESS:   Monique Richmond 30 y.o. female is here because of ATM mutation  Monique Richmond is here for a follow up visit. Since last visit, she had an MRI. She is otherwise doing well. No complaints. No change in breathing, bowel habits or urinary habits No interim infections or hospitalizations. Rest of the pertinent 10 point ROS reviewed and negative.  REVIEW OF SYSTEMS:   Constitutional: Denies fevers, chills or abnormal night sweats Eyes: Denies blurriness of vision, double vision or watery eyes Ears, nose, mouth, throat, and face: Denies mucositis or sore throat Respiratory: Denies cough, dyspnea or wheezes Cardiovascular: Denies palpitation, chest discomfort or lower extremity swelling Gastrointestinal:  Denies nausea, heartburn or change in bowel habits Skin: Denies abnormal skin rashes Lymphatics: Denies new lymphadenopathy or easy bruising Neurological:Denies numbness, tingling or new weaknesses Behavioral/Psych: Mood  is stable, no new changes  All other systems were reviewed with the patient and are negative.  MEDICAL HISTORY:  Past Medical History:  Diagnosis Date   Family history of breast cancer    Family history of colon cancer 04/26/2020   Family history of colon cancer     SURGICAL HISTORY: No significant PSH  SOCIAL HISTORY: Social History   Socioeconomic History   Marital status: Married    Spouse name: Not on file   Number of children: Not on file   Years of education: Not on file   Highest education level: Not on file  Occupational History   Not on file  Tobacco Use   Smoking status: Not on file   Smokeless tobacco: Not on file  Substance and Sexual Activity   Alcohol use: Not on file   Drug use: Not on file   Sexual activity: Not on file  Other Topics Concern   Not on file  Social History Narrative   Not on file   Social Determinants of Health   Financial Resource Strain: Not on file  Food Insecurity: Not on file  Transportation Needs: Not on file  Physical Activity: Not on file  Stress: Not on file  Social Connections: Not on file  Intimate Partner Violence: Not on file    FAMILY HISTORY: Family History  Problem Relation Age of Onset   Colon cancer Father 103   Lymphoma Father 54   Other Father        ATM+   Breast cancer Maternal Aunt    Thyroid cancer Maternal Aunt    Breast cancer Paternal Aunt 61  ATM+   Dementia Maternal Grandmother    Heart attack Paternal Grandfather    Stroke Paternal Grandfather    Other Half-Sister        ATM+   Breast cancer Half-Sister 56   Other Half-Sister        ATM+   Breast cancer Paternal Aunt 76       ATM+   Brain cancer Cousin 30       benign brain tumors    ALLERGIES:  is allergic to amoxicillin, cat hair extract, and pollen extract-tree extract [pollen extract].  MEDICATIONS:  Current Outpatient Medications  Medication Sig Dispense Refill   buPROPion (WELLBUTRIN) 100 MG tablet Take 100 mg by mouth 2  (two) times daily.     Omega-3 Fatty Acids (FISH OIL) 1200 MG CAPS Take 1,200 mg by mouth once.     No current facility-administered medications for this visit.    PHYSICAL EXAMINATION: ECOG PERFORMANCE STATUS: 0 - Asymptomatic  Vitals:   12/16/20 0829  BP: 115/72  Pulse: 88  Resp: 20  Temp: 97.7 F (36.5 C)  SpO2: 96%   Filed Weights   12/16/20 0829  Weight: 208 lb 3.2 oz (94.4 kg)   PE deferred in lieu of counseling   LABORATORY DATA:  I have reviewed the data as listed No results found for: WBC, HGB, HCT, MCV, PLT   Chemistry   No results found for: NA, K, CL, CO2, BUN, CREATININE, GLU No results found for: CALCIUM, ALKPHOS, AST, ALT, BILITOT    RADIOGRAPHIC STUDIES: I have personally reviewed the radiological images as listed and agreed with the findings in the report. MR BREAST BILATERAL W WO CONTRAST INC CAD  Result Date: 12/14/2020 CLINICAL DATA:  Family history of breast cancer. The patient's aunt had breast cancer in her 23s. The patient's sister had breast cancer at the age of 22. LABS:  None EXAM: BILATERAL BREAST MRI WITH AND WITHOUT CONTRAST TECHNIQUE: Multiplanar, multisequence MR images of both breasts were obtained prior to and following the intravenous administration of 10 ml of Gadavist Three-dimensional MR images were rendered by post-processing of the original MR data on an independent workstation. The three-dimensional MR images were interpreted, and findings are reported in the following complete MRI report for this study. Three dimensional images were evaluated at the independent interpreting workstation using the DynaCAD thin client. COMPARISON:  Screening mammography November 11, 2020 FINDINGS: Breast composition: b. Scattered fibroglandular tissue. Background parenchymal enhancement: Moderate. Right breast: No mass or abnormal enhancement. Left breast: There are 2 masses in the left breast. The first is located at an anterior depth at approximately 5 o'clock  as seen on series 12, image 69 measuring 5 mm. This mass demonstrates persistent enhancement kinetics. More inferiorly in the breast is another mass as seen on series 12, image 109. This mass also measures 5 mm with a mixture of persistent and plateau kinetics. No other suspicious findings are seen in the left breast. Lymph nodes: No abnormal appearing lymph nodes. Ancillary findings:  None. IMPRESSION: Two indeterminate masses in the left breast as above. No suspicious findings on the right. RECOMMENDATION: Recommend MRI guided biopsy of the 2 indeterminate left breast masses. BI-RADS CATEGORY  4: Suspicious. Electronically Signed   By: Dorise Bullion III M.D.   On: 12/14/2020 15:49   All questions were answered. The patient knows to call the clinic with any problems, questions or concerns. I spent 15 minutes in the care of this patient including H and P, review  of records, counseling and coordination of care.     Benay Pike, MD 12/16/2020 9:04 AM

## 2020-12-16 ENCOUNTER — Encounter: Payer: Self-pay | Admitting: Hematology and Oncology

## 2020-12-16 ENCOUNTER — Other Ambulatory Visit: Payer: Self-pay

## 2020-12-16 ENCOUNTER — Inpatient Hospital Stay: Payer: BC Managed Care – PPO | Attending: Hematology and Oncology | Admitting: Hematology and Oncology

## 2020-12-16 VITALS — BP 115/72 | HR 88 | Temp 97.7°F | Resp 20 | Ht 63.0 in | Wt 208.2 lb

## 2020-12-16 DIAGNOSIS — Z1501 Genetic susceptibility to malignant neoplasm of breast: Secondary | ICD-10-CM | POA: Diagnosis not present

## 2020-12-16 DIAGNOSIS — Z807 Family history of other malignant neoplasms of lymphoid, hematopoietic and related tissues: Secondary | ICD-10-CM | POA: Diagnosis not present

## 2020-12-16 DIAGNOSIS — Z803 Family history of malignant neoplasm of breast: Secondary | ICD-10-CM | POA: Diagnosis not present

## 2020-12-16 DIAGNOSIS — Z808 Family history of malignant neoplasm of other organs or systems: Secondary | ICD-10-CM | POA: Insufficient documentation

## 2020-12-16 DIAGNOSIS — Z1509 Genetic susceptibility to other malignant neoplasm: Secondary | ICD-10-CM | POA: Diagnosis not present

## 2020-12-16 DIAGNOSIS — N6320 Unspecified lump in the left breast, unspecified quadrant: Secondary | ICD-10-CM | POA: Insufficient documentation

## 2020-12-16 DIAGNOSIS — R928 Other abnormal and inconclusive findings on diagnostic imaging of breast: Secondary | ICD-10-CM | POA: Diagnosis not present

## 2020-12-16 DIAGNOSIS — Z1589 Genetic susceptibility to other disease: Secondary | ICD-10-CM | POA: Diagnosis not present

## 2020-12-30 ENCOUNTER — Ambulatory Visit: Payer: BC Managed Care – PPO | Admitting: Hematology and Oncology

## 2021-01-19 ENCOUNTER — Ambulatory Visit
Admission: RE | Admit: 2021-01-19 | Discharge: 2021-01-19 | Disposition: A | Discharge status: Home / Self Care | Payer: BC Managed Care – PPO | Source: Ambulatory Visit | Attending: Hematology and Oncology | Admitting: Hematology and Oncology

## 2021-01-19 ENCOUNTER — Other Ambulatory Visit: Payer: Self-pay

## 2021-01-19 DIAGNOSIS — R9389 Abnormal findings on diagnostic imaging of other specified body structures: Secondary | ICD-10-CM

## 2021-01-19 HISTORY — PX: BREAST BIOPSY: SHX20

## 2021-01-19 IMAGING — MG MM BREAST LOCALIZATION CLIP
4 series · 4 of 12 positions shown · non-contrast
Comparison: Previous exam(s).

CLINICAL DATA: Evaluate placement of CYLINDER and BARBELL clips
following MR guided LEFT breast biopsies.

EXAM:
3D DIAGNOSTIC LEFT MAMMOGRAM POST MRI BIOPSY

[L CC synth-2D]
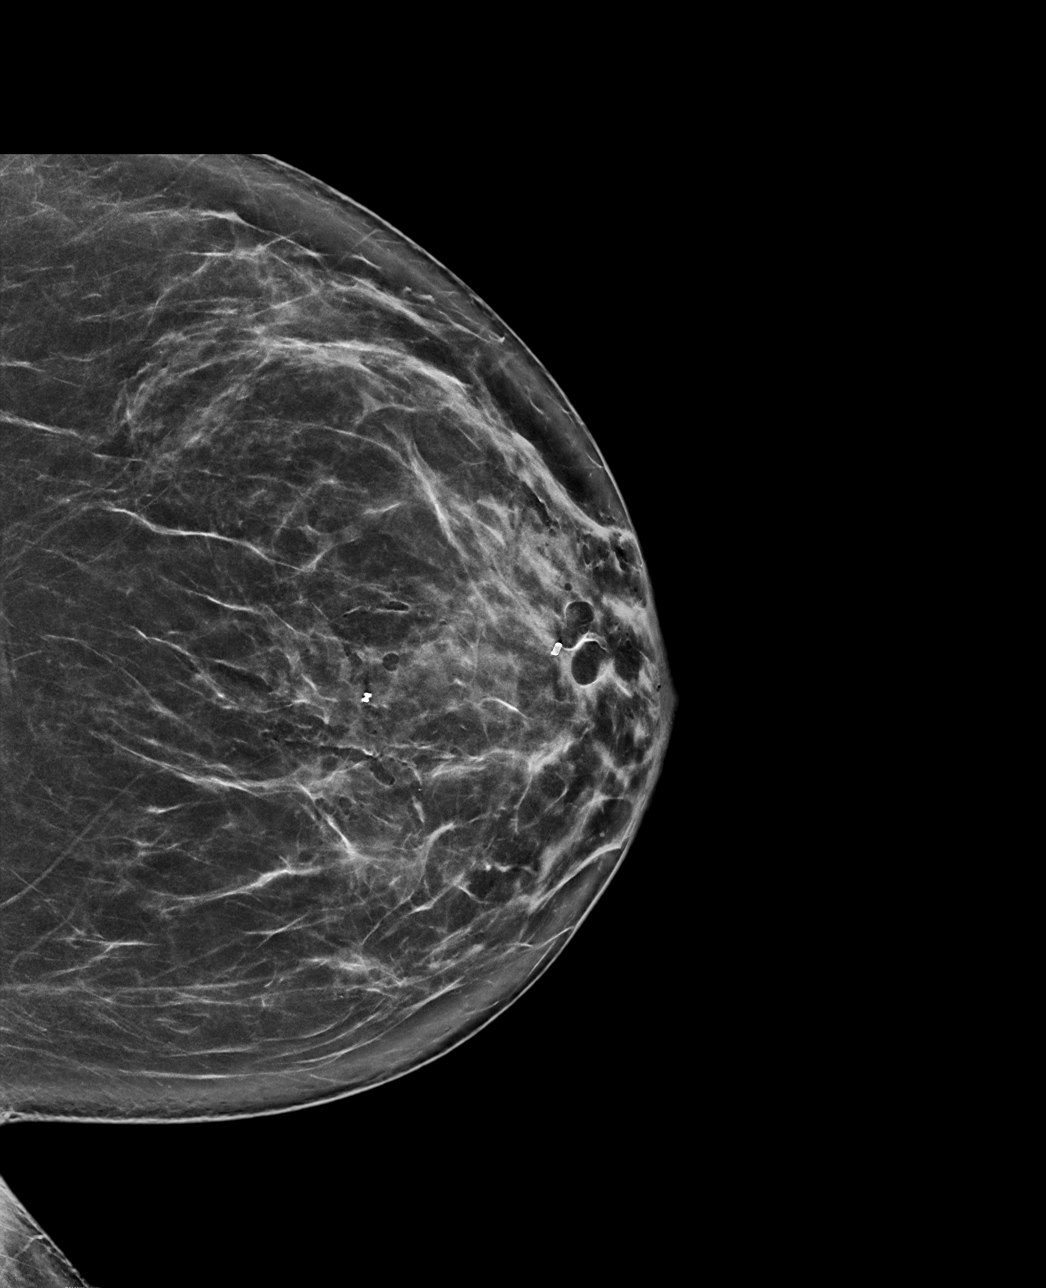

[L ML synth-2D]
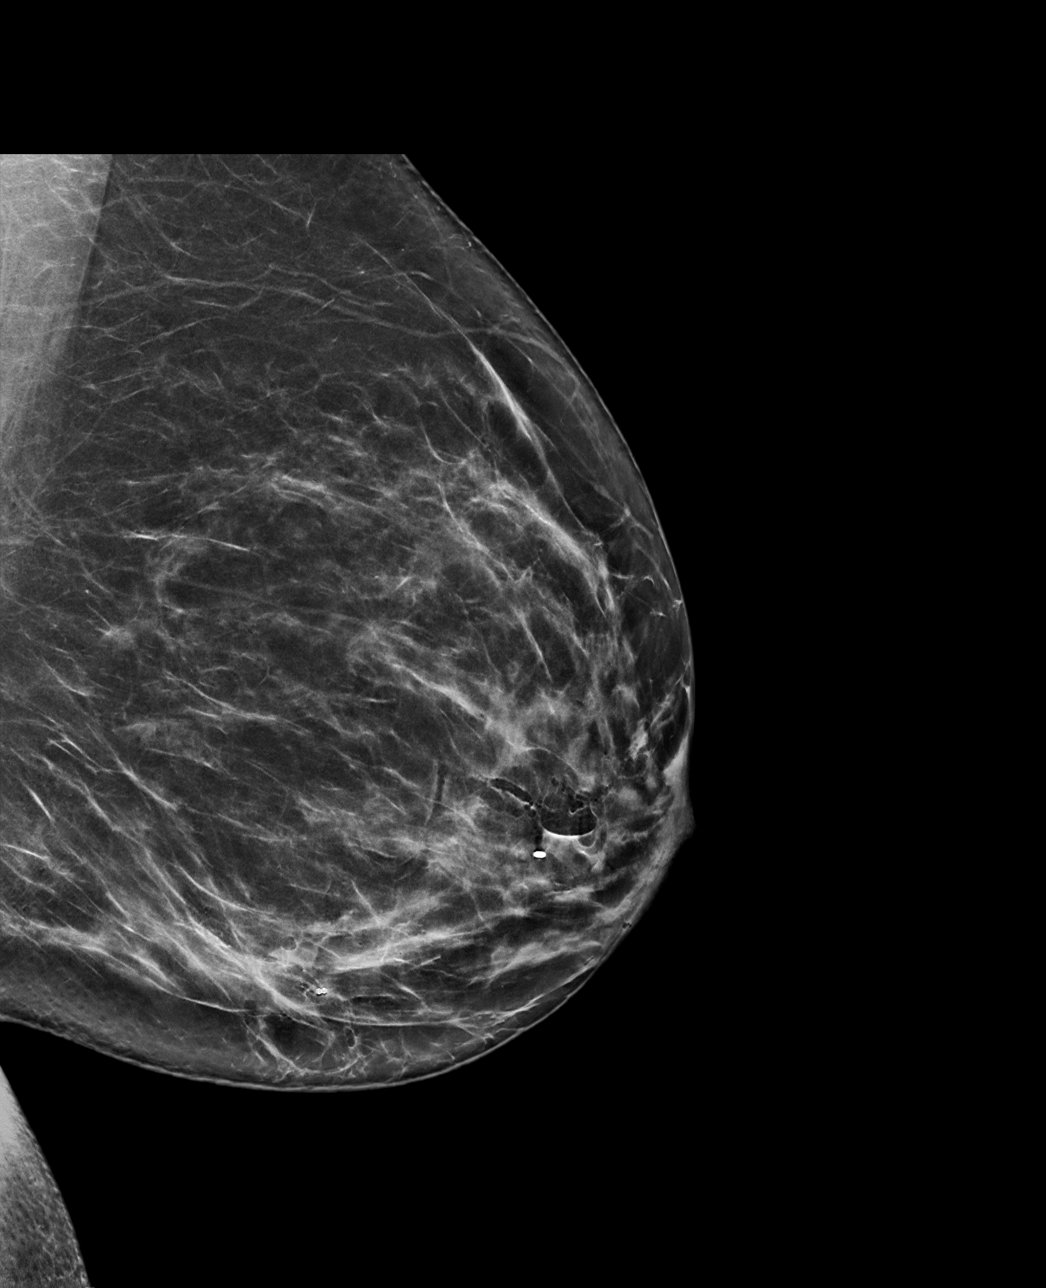

[L CC tomo · tomo slice 40/79.0]
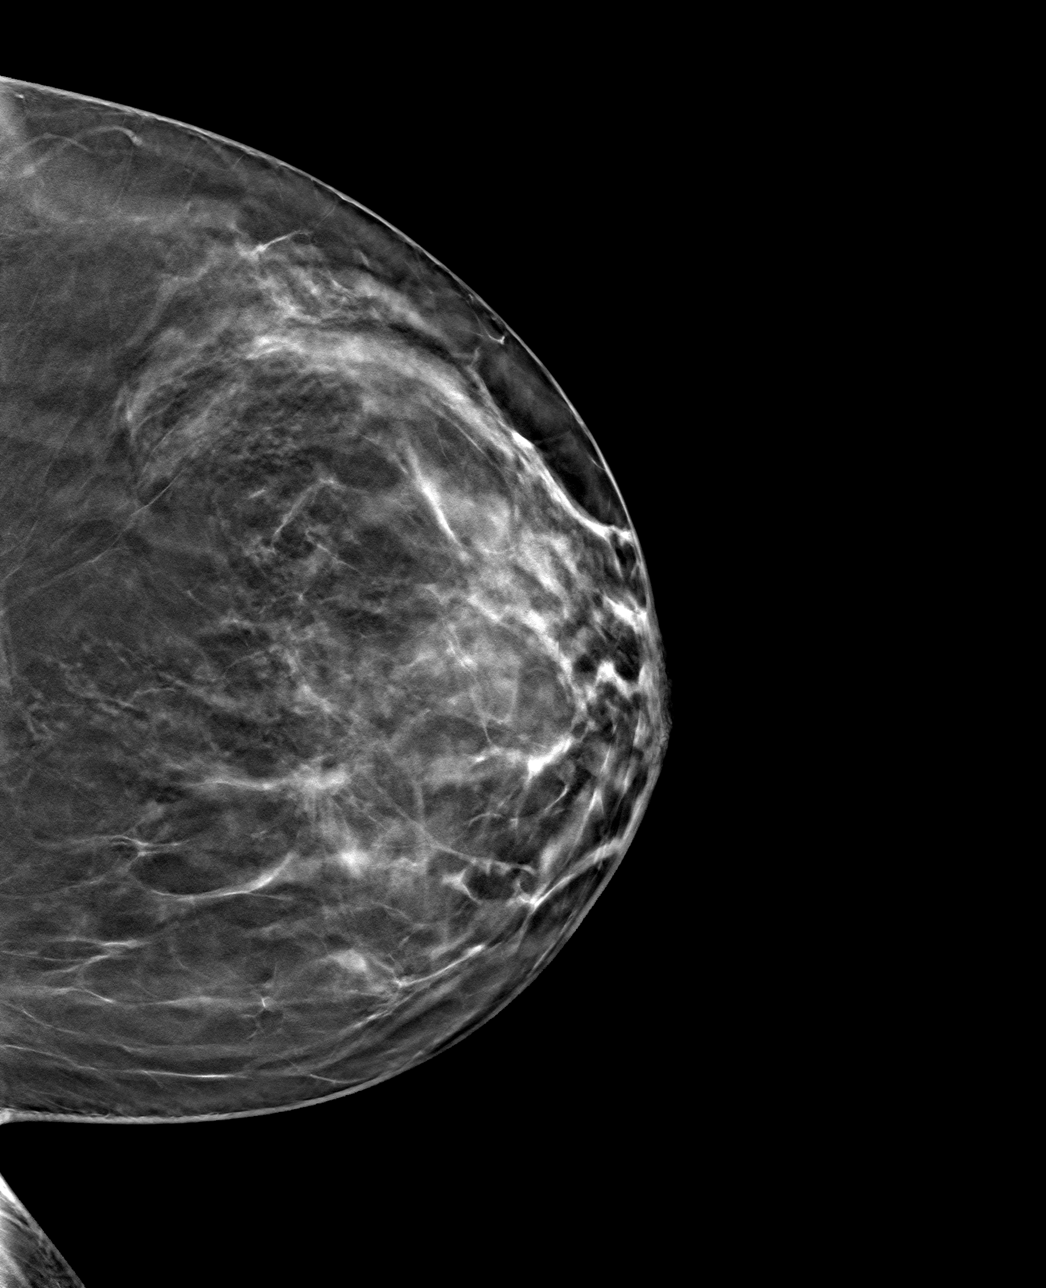

[L ML tomo · tomo slice 41/82.0]
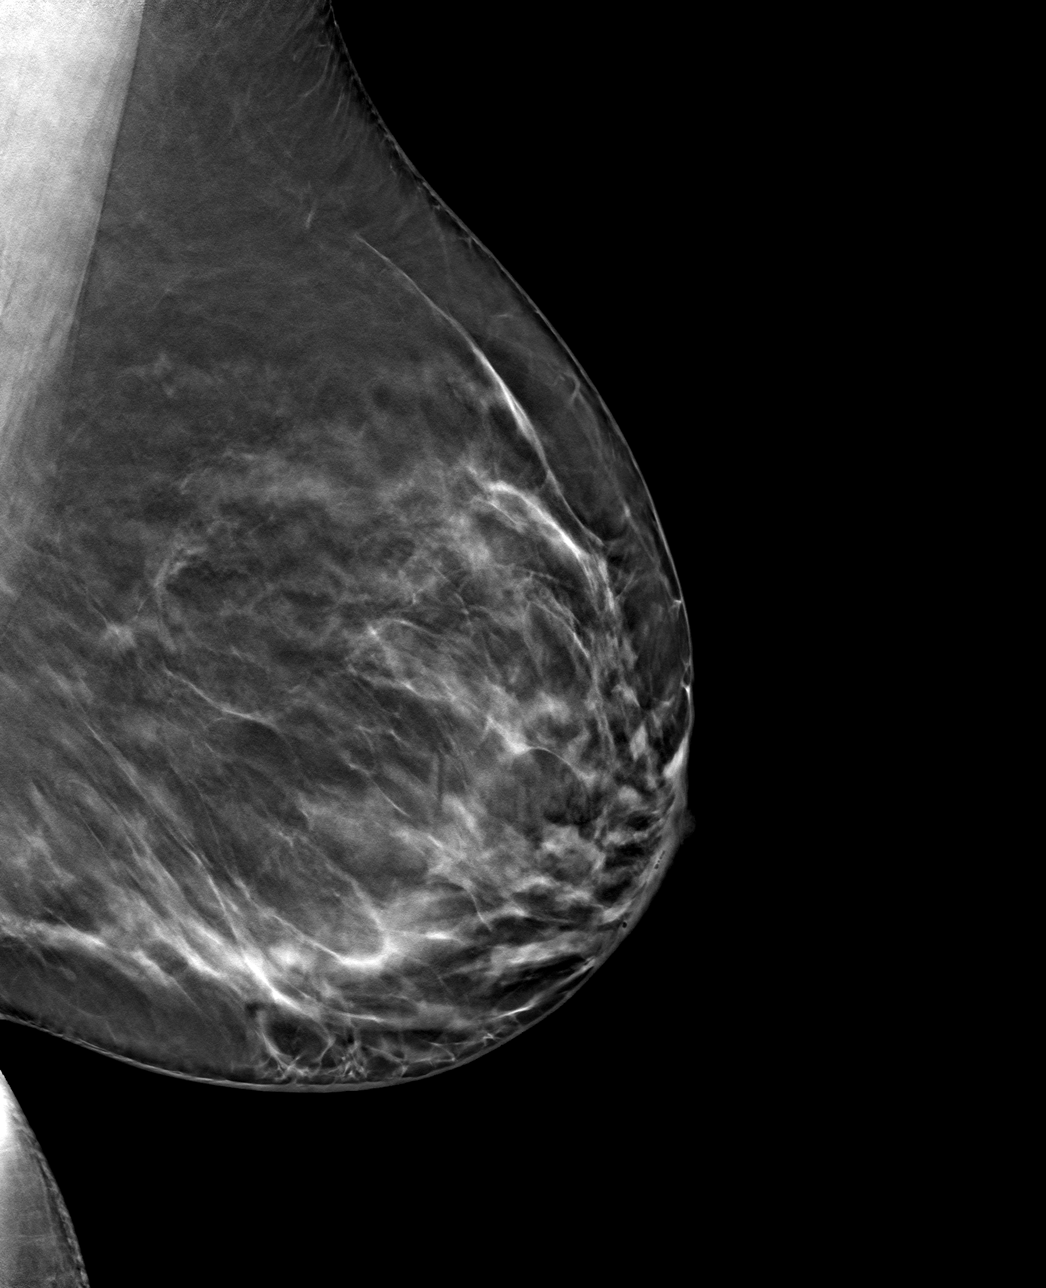

[4 of 12 positions shown; findings below may reference images not displayed]

FINDINGS: 3D Mammographic images were obtained following MR guided biopsy of 2
separate 0.5 cm masses within the LOWER OUTER LEFT breast.

The CYLINDER biopsy marking clip is in expected position at the site
of biopsy within the anterior LOWER OUTER LEFT breast.

The BARBELL biopsy marking clip is in expected position at the site
of biopsy within the LOWER slightly OUTER LEFT breast.
IMPRESSION: Appropriate positioning of the CYLINDER shaped biopsy marking clip
at the site of biopsy in the LOWER OUTER LEFT breast.

Appropriate positioning of the BARBELL biopsy marking clip at the
site of biopsy in the LOWER slightly OUTER LEFT breast.

Final Assessment: Post Procedure Mammograms for Marker Placement

## 2021-01-19 IMAGING — MR MR BREAST BX W LOC DEV EA ADD LESION IMAGE BX SPEC MR GUIDE*L*
8 of 12 series · 31 of 48 positions shown · IV contrast (10 ml gadavist)
Comparison: Previous exams.
COMPARISON: Previous exams.

Addendum:
CLINICAL DATA: 30-year-old female for tissue sampling of two
separate 0.5 cm LOWER OUTER LEFT breast [OR] within the anterior
LOWER OUTER LEFT breast (CYLINDER clip) and 1 further within the
LOWER OUTER LEFT breast (BARBELL clip).

EXAM:
MRI GUIDED CORE NEEDLE BIOPSY OF THE LEFT BREAST X 2
TECHNIQUE: Multiplanar, multisequence MR imaging of the breast was performed
both before and after administration of intravenous contrast.
CONTRAST:  10mL GADAVIST GADOBUTROL 1 MMOL/ML IV SOLN

[Series 2: fiducial unilateral · sagittal · 2.0mm · 1.33mm/px · 3 of 52 slices shown]
[im 1/52]
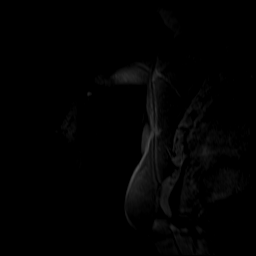
[im 26/52]
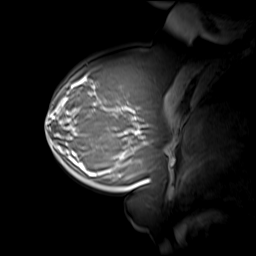
[im 52/52]
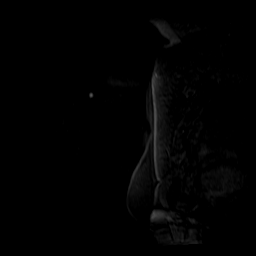

[Series 3: dynamic pre · axial · non-contrast · 1.3mm · 0.73mm/px · z∈[-65,+121]mm · 5 of 144 slices shown]
[im 1/144]
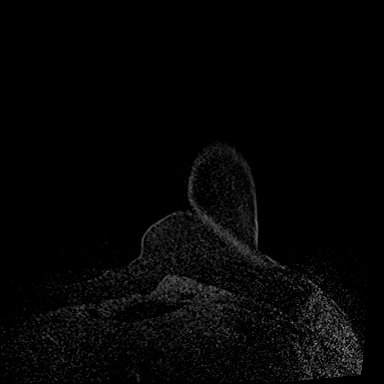
[im 36/144]
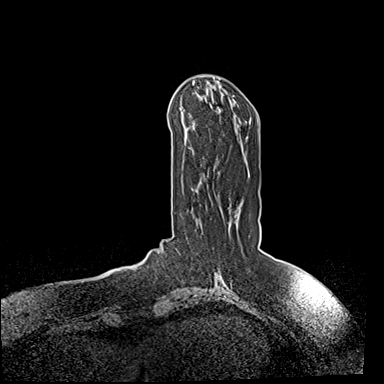
[im 72/144]
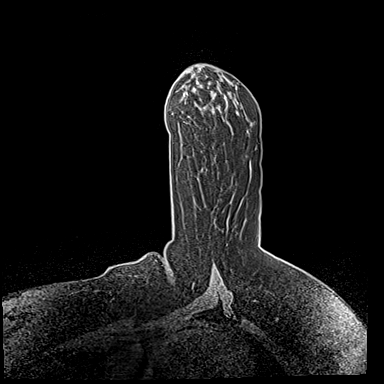
[im 108/144]
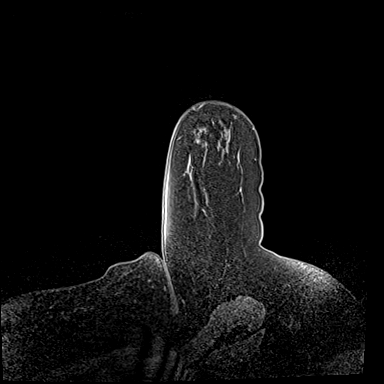
[im 144/144]
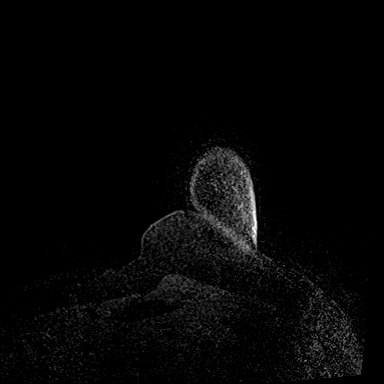

[Series 4: dynamic post 20 · axial · 1.3mm · 0.73mm/px · z∈[-65,+121]mm · 4 of 144 slices shown (1 of 2)]
[im 1/144]
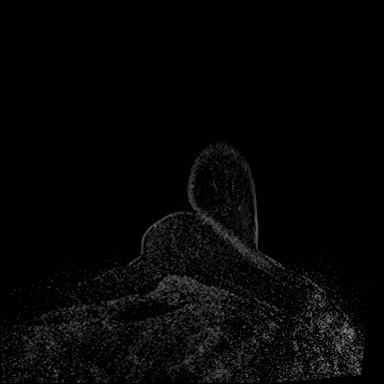
[im 48/144]
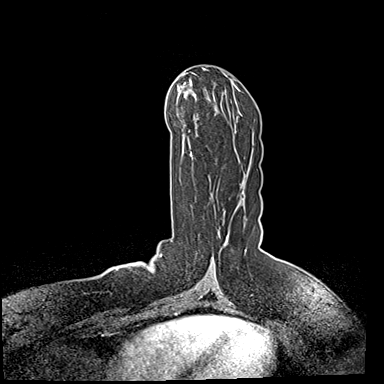
[im 96/144]
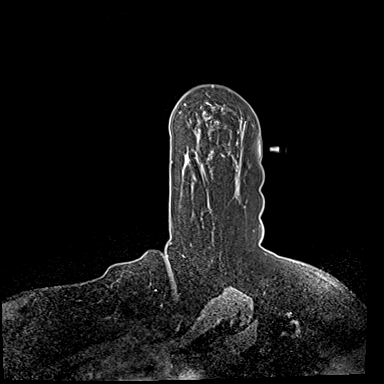
[im 144/144]
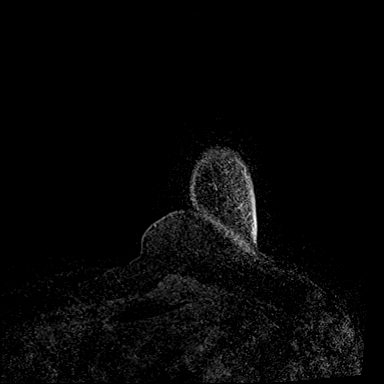

[Series 5: dynamic post 20 · axial · 1.3mm · 0.73mm/px · z∈[-65,+121]mm · 4 of 144 slices shown (2 of 2)]
[im 1/144]
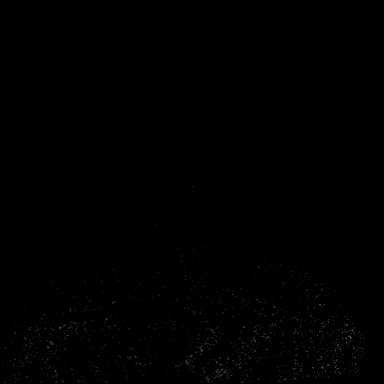
[im 48/144]
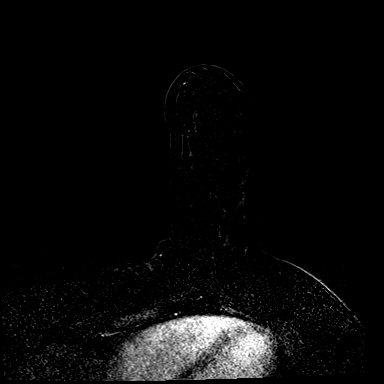
[im 96/144]
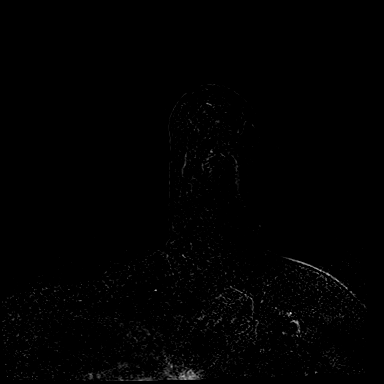
[im 144/144]
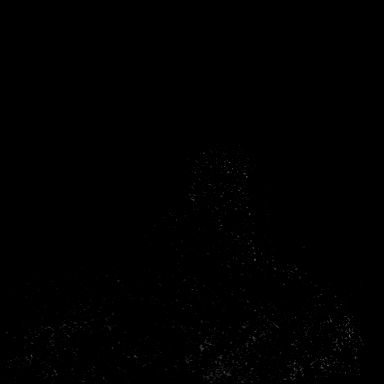

[Series 6: dynamic post 3 · axial · 1.3mm · 0.73mm/px · z∈[-65,+121]mm · 4 of 144 slices shown (1 of 2)]
[im 1/144]
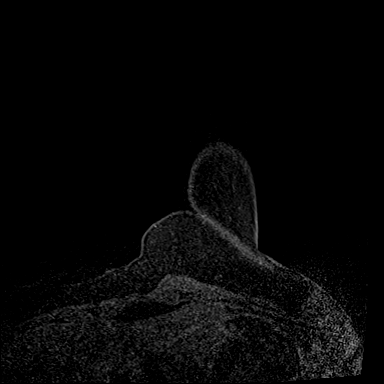
[im 48/144]
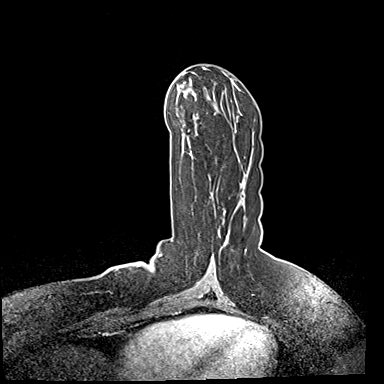
[im 96/144]
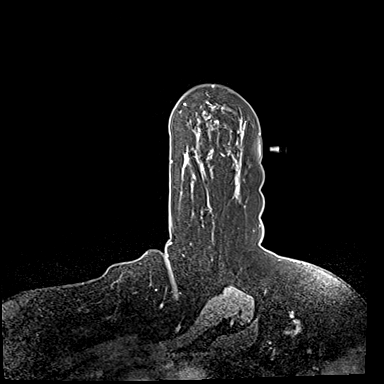
[im 144/144]
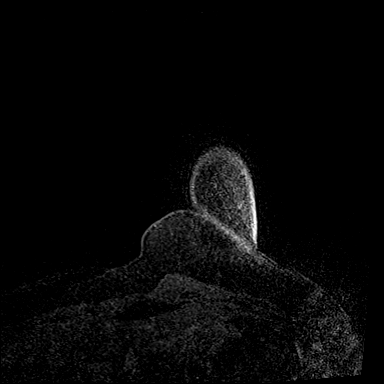

[Series 7: dynamic post 3 · axial · 1.3mm · 0.73mm/px · z∈[-65,+121]mm · 4 of 144 slices shown (2 of 2)]
[im 1/144]
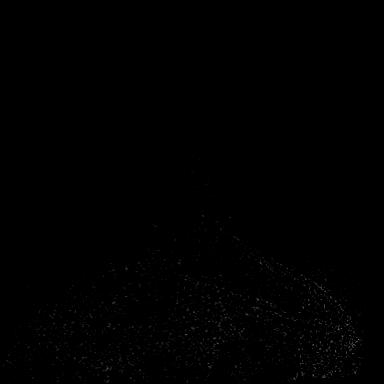
[im 48/144]
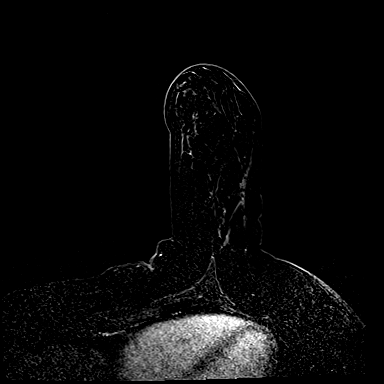
[im 96/144]
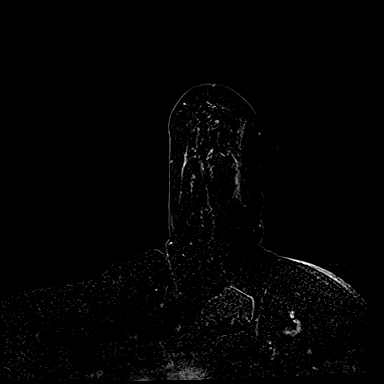
[im 144/144]
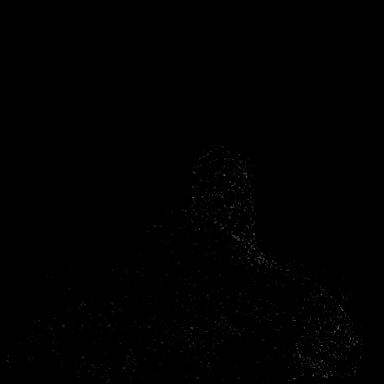

[Series 8: needle confirmation · axial · 1.3mm · 0.73mm/px · z∈[-65,+121]mm · 4 of 144 slices shown]
[im 1/144]
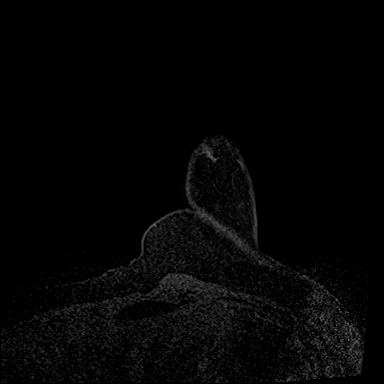
[im 48/144]
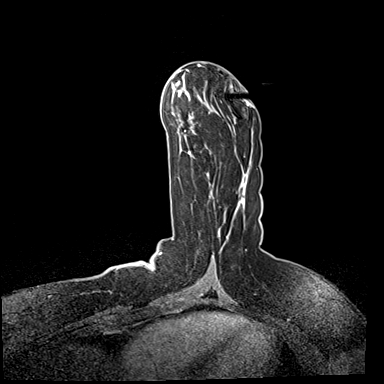
[im 96/144]
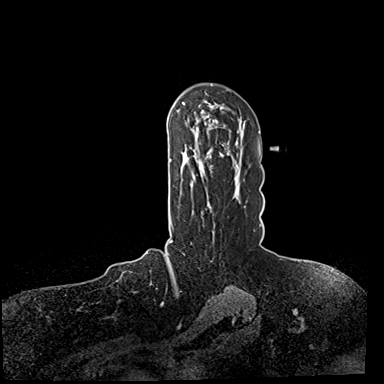
[im 144/144]
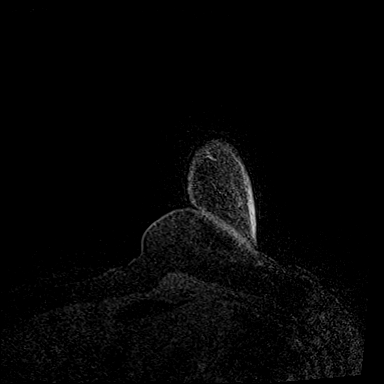

[Series 9: needle confirmation_sub · axial · 1.3mm · 0.73mm/px · z∈[-65,+58]mm · 3 of 144 slices shown]
[im 1/144]
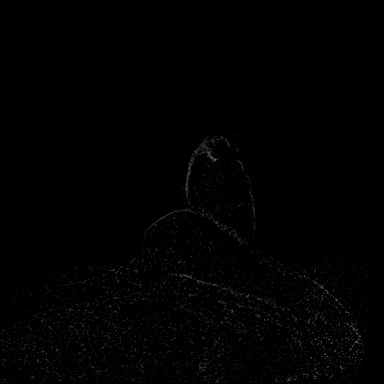
[im 48/144]
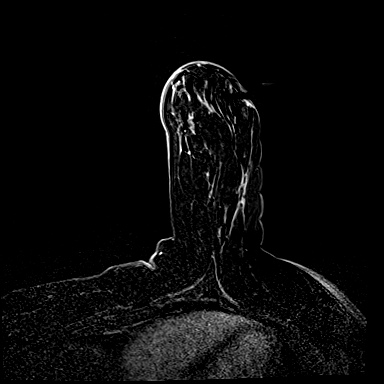
[im 96/144]
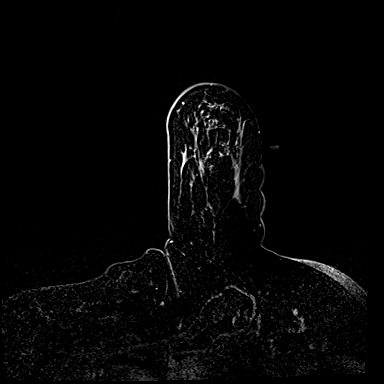

[31 of 48 positions shown; findings below may reference images not displayed]

FINDINGS: I met with the patient, and we discussed the procedures of MRI
guided biopsy, including risks, benefits, and alternatives.
Specifically, we discussed the risks of infection, bleeding, tissue
injury, clip migration, and inadequate sampling. Informed, written
consent was given. The usual time out protocol was performed
immediately prior to the procedures.

MRI GUIDED CORE NEEDLE BIOPSY OF THE LEFT BREAST #1 (0.5 cm anterior
LOWER OUTER LEFT breast mass-CYLINDER clip):

Using sterile technique, 1% Lidocaine with and without epinephrine,
MRI guidance, and a 9 gauge vacuum assisted device, biopsy was
performed of the 0.5 cm anterior LOWER OUTER LEFT breast mass using
a LATERAL approach. At the conclusion of the procedure, a CYLINDER
tissue marker clip was deployed into the biopsy cavity. Follow-up
2-view mammogram was performed and dictated separately.

MRI GUIDED CORE NEEDLE BIOPSY OF THE LEFT BREAST #2 (0.5 cm far
LOWER OUTER LEFT breast mass-BARBELL clip):

Using sterile technique, 1% Lidocaine with and without epinephrine,
MRI guidance, and a 9 gauge vacuum assisted device, biopsy was
performed of the 0.5 cm far LOWER OUTER LEFT breast mass using a
LATERAL approach. At the conclusion of the procedure, a BARBELL
tissue marker clip was deployed into the biopsy cavity. Follow-up
2-view mammogram was performed and dictated separately.
IMPRESSION: MRI guided biopsy of 2 separate 0.5 cm LOWER OUTER LEFT breast
masses. No apparent complications.

ADDENDUM:
Pathology revealed FIBROCYSTIC CHANGES WITH APOCRINE METAPLASIA of
the LEFT breast, anterior lower outer, (cylinder clip). This was
found to be concordant by Dr. DEL ROSRIO GURUM.

Pathology revealed FIBROCYSTIC CHANGES WITH USUAL DUCTAL HYPERPLASIA
AND FLORID PAPILLARY APOCRINE HYPERPLASIA of the LEFT breast, far
lower outer, (barbell clip). This was found to be concordant by Dr.
DEL ROSRIO GURUM.

Pathology results were discussed with the patient by telephone. The
patient reported doing well after the biopsies with tenderness at
the sites. Post biopsy instructions and care were reviewed and
questions were answered. The patient was encouraged to call The
direct phone number was provided.

The patient was instructed to return for a bilateral breast MRI in 6
months, per protocol, and to continue annual screening mammography,
due in [DATE].

Pathology results reported by DEL ROSRIO GURUM, RN on [DATE].

*** End of Addendum ***
FINDINGS: I met with the patient, and we discussed the procedures of MRI
guided biopsy, including risks, benefits, and alternatives.
Specifically, we discussed the risks of infection, bleeding, tissue
injury, clip migration, and inadequate sampling. Informed, written
consent was given. The usual time out protocol was performed
immediately prior to the procedures.

MRI GUIDED CORE NEEDLE BIOPSY OF THE LEFT BREAST #1 (0.5 cm anterior
LOWER OUTER LEFT breast mass-CYLINDER clip):

Using sterile technique, 1% Lidocaine with and without epinephrine,
MRI guidance, and a 9 gauge vacuum assisted device, biopsy was
performed of the 0.5 cm anterior LOWER OUTER LEFT breast mass using
a LATERAL approach. At the conclusion of the procedure, a CYLINDER
tissue marker clip was deployed into the biopsy cavity. Follow-up
2-view mammogram was performed and dictated separately.

MRI GUIDED CORE NEEDLE BIOPSY OF THE LEFT BREAST #2 (0.5 cm far
LOWER OUTER LEFT breast mass-BARBELL clip):

Using sterile technique, 1% Lidocaine with and without epinephrine,
MRI guidance, and a 9 gauge vacuum assisted device, biopsy was
performed of the 0.5 cm far LOWER OUTER LEFT breast mass using a
LATERAL approach. At the conclusion of the procedure, a BARBELL
tissue marker clip was deployed into the biopsy cavity. Follow-up
2-view mammogram was performed and dictated separately.
IMPRESSION: MRI guided biopsy of 2 separate 0.5 cm LOWER OUTER LEFT breast
masses. No apparent complications.

## 2021-01-19 MED ORDER — GADOBUTROL 1 MMOL/ML IV SOLN
10.0000 mL | Freq: Once | INTRAVENOUS | Status: AC | PRN
  Administered 2021-01-19: 10 mL via INTRAVENOUS

## 2021-01-20 ENCOUNTER — Other Ambulatory Visit: Payer: Self-pay | Admitting: Hematology and Oncology

## 2021-01-20 DIAGNOSIS — Z1501 Genetic susceptibility to malignant neoplasm of breast: Secondary | ICD-10-CM

## 2021-01-26 ENCOUNTER — Inpatient Hospital Stay: Payer: BC Managed Care – PPO | Attending: Hematology and Oncology | Admitting: Hematology and Oncology

## 2021-01-26 ENCOUNTER — Other Ambulatory Visit: Payer: Self-pay

## 2021-01-26 VITALS — BP 101/76 | HR 87 | Temp 98.0°F | Resp 17 | Wt 209.6 lb

## 2021-01-26 DIAGNOSIS — Z1589 Genetic susceptibility to other disease: Secondary | ICD-10-CM | POA: Diagnosis not present

## 2021-01-26 DIAGNOSIS — Z1501 Genetic susceptibility to malignant neoplasm of breast: Secondary | ICD-10-CM | POA: Insufficient documentation

## 2021-01-26 DIAGNOSIS — Z803 Family history of malignant neoplasm of breast: Secondary | ICD-10-CM | POA: Diagnosis not present

## 2021-01-26 DIAGNOSIS — Z1509 Genetic susceptibility to other malignant neoplasm: Secondary | ICD-10-CM | POA: Insufficient documentation

## 2021-01-26 DIAGNOSIS — N6323 Unspecified lump in the left breast, lower outer quadrant: Secondary | ICD-10-CM | POA: Insufficient documentation

## 2021-01-26 DIAGNOSIS — N6320 Unspecified lump in the left breast, unspecified quadrant: Secondary | ICD-10-CM | POA: Diagnosis present

## 2021-01-26 DIAGNOSIS — R928 Other abnormal and inconclusive findings on diagnostic imaging of breast: Secondary | ICD-10-CM | POA: Diagnosis not present

## 2021-01-26 NOTE — Progress Notes (Signed)
[facility] CONSULT NOTE  Patient Care Team: Pcp, No as PCP - General  CHIEF COMPLAINTS/PURPOSE OF CONSULTATION:   Heterozygous for ATM mutation  ASSESSMENT & PLAN:   This is a very pleasant 30 yr old female patient with ATM heterozygous mutation referred to high risk breast clinic for recommendations. During her last visit, we discussed about mammogram and MRI given her life time risk of breast cancer. She underwent breast MRI which showed two indeterminate masses in the left breast. No suspicious findings on the right. MR guided biopsy has been ordered. 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 Breast exam today, bruising from recent biopsy of the left breast, otherwise no major breast abnormalities or regional adenopathy. Since her half sister had BC at the age of 29, she had a mammogram/MRI at 45, she is however not sure about doing annual MRI's given the cost, and also because she is contemplating bilateral mastectomy for gender change. She will RTC in 6 months, SBE recommended monthly. She was encouraged to contact us sooner with any new questions or concerns.  HISTORY OF PRESENTING ILLNESS:   Monique Richmond 30 y.o. female is here because of ATM mutation  Ms Sandin is here for a follow up visit. Since last visit, she had an MRI guided biopsy, benign findings noted. She is otherwise doing well. No complaints. She is contemplating gender change, bilateral mastectomy and also worried about the cost of MRI's hence would like to think about the frequency of MRI, she may not want to do it annually.  Rest of the pertinent 10 point ROS reviewed and negative.  REVIEW OF SYSTEMS:   Constitutional: Denies fevers, chills or abnormal night sweats Eyes: Denies blurriness of  vision, double vision or watery eyes Ears, nose, mouth, throat, and face: Denies mucositis or sore throat Respiratory: Denies cough, dyspnea or wheezes Cardiovascular: Denies palpitation, chest discomfort or lower extremity swelling Gastrointestinal:  Denies nausea, heartburn or change in bowel habits Skin: Denies abnormal skin rashes Lymphatics: Denies new lymphadenopathy or easy bruising Neurological:Denies numbness, tingling or new weaknesses Behavioral/Psych: Mood is stable, no new changes  All other systems were reviewed with the patient and are negative.  MEDICAL HISTORY:  Past Medical History:  Diagnosis Date   Family history of breast cancer    Family history of colon cancer 04/26/2020   Family history of colon cancer     SURGICAL HISTORY: No significant PSH  SOCIAL HISTORY: Social History   Socioeconomic History   Marital status: Married    Spouse name: Not on file   Number of children: Not on file   Years of education: Not on file   Highest education level: Not on file  Occupational History   Not on file  Tobacco Use   Smoking status: Not on file   Smokeless tobacco: Not on file  Substance and Sexual Activity   Alcohol use: Not on file   Drug use: Not on file   Sexual activity: Not on file  Other Topics Concern   Not on file  Social History Narrative   Not on file   Social Determinants of Health   Financial Resource Strain: Not on file  Food Insecurity: Not on file  Transportation Needs: Not on file  Physical Activity: Not on file  Stress: Not on file  Social  Connections: Not on file  Intimate Partner Violence: Not on file    FAMILY HISTORY: Family History  Problem Relation Age of Onset   Colon cancer Father 6   Lymphoma Father 25   Other Father        ATM+   Breast cancer Maternal Aunt    Thyroid cancer Maternal Aunt    Breast cancer Paternal Aunt 70       ATM+   Dementia Maternal Grandmother    Heart attack Paternal Grandfather     Stroke Paternal Grandfather    Other Half-Sister        ATM+   Breast cancer Half-Sister 71   Other Half-Sister        ATM+   Breast cancer Paternal Aunt 51       ATM+   Brain cancer Cousin 14       benign brain tumors    ALLERGIES:  is allergic to amoxicillin, cat hair extract, and pollen extract-tree extract [pollen extract].  MEDICATIONS:  Current Outpatient Medications  Medication Sig Dispense Refill   buPROPion (WELLBUTRIN) 100 MG tablet Take 100 mg by mouth 2 (two) times daily.     Omega-3 Fatty Acids (FISH OIL) 1200 MG CAPS Take 1,200 mg by mouth once.     No current facility-administered medications for this visit.    PHYSICAL EXAMINATION: ECOG PERFORMANCE STATUS: 0 - Asymptomatic  LMP 12/30/2020  Breast: Bilateral breasts inspected, no palpable masses. Bruising from recent biopsy. No regional adenopathy No LE edema  LABORATORY DATA:  I have reviewed the data as listed No results found for: WBC, HGB, HCT, MCV, PLT   Chemistry   No results found for: NA, K, CL, CO2, BUN, CREATININE, GLU No results found for: CALCIUM, ALKPHOS, AST, ALT, BILITOT    RADIOGRAPHIC STUDIES: I have personally reviewed the radiological images as listed and agreed with the findings in the report. MM CLIP PLACEMENT LEFT  Result Date: 01/19/2021 CLINICAL DATA:  Evaluate placement of CYLINDER and BARBELL clips following MR guided LEFT breast biopsies. EXAM: 3D DIAGNOSTIC LEFT MAMMOGRAM POST MRI BIOPSY COMPARISON:  Previous exam(s). FINDINGS: 3D Mammographic images were obtained following MR guided biopsy of 2 separate 0.5 cm masses within the LOWER OUTER LEFT breast. The CYLINDER biopsy marking clip is in expected position at the site of biopsy within the anterior LOWER OUTER LEFT breast. The BARBELL biopsy marking clip is in expected position at the site of biopsy within the LOWER slightly OUTER LEFT breast. IMPRESSION: Appropriate positioning of the CYLINDER shaped biopsy marking clip at the  site of biopsy in the LOWER OUTER LEFT breast. Appropriate positioning of the BARBELL biopsy marking clip at the site of biopsy in the LOWER slightly OUTER LEFT breast. Final Assessment: Post Procedure Mammograms for Marker Placement Electronically Signed   By: Margarette Canada M.D.   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 This was found to be concordant by Dr. Hassan Rowan. 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 This was found to be concordant by Dr. Hassan Rowan. Pathology results were discussed with the patient by telephone. The patient reported doing well after the biopsies with tenderness at the sites. Post biopsy instructions  and care were reviewed and questions were answered. The patient was encouraged to call [facility] for any additional concerns. My direct phone number was provided. The patient was instructed to return for a bilateral breast MRI in 6 months, per protocol, and to continue annual screening mammography, due in July 2023. Pathology results reported by Terie Purser, RN on 01/20/2021. Electronically Signed   By: Margarette Canada M.D.   On: 01/20/2021 12:40   Result Date: 01/20/2021 CLINICAL DATA:  30 year old female for tissue sampling of two separate 0.5 cm LOWER OUTER LEFT breast masses-1 within the anterior LOWER OUTER LEFT breast (CYLINDER clip) and 1 further within the LOWER OUTER LEFT breast (BARBELL clip). EXAM: MRI GUIDED CORE NEEDLE BIOPSY OF THE LEFT BREAST X 2 TECHNIQUE: Multiplanar, multisequence MR imaging of the breast was performed both before and after administration of intravenous contrast. CONTRAST:  23mL  GADAVIST GADOBUTROL 1 MMOL/ML IV SOLN COMPARISON:  Previous exams. FINDINGS: I met with the patient, and we discussed the procedures of MRI guided biopsy, including risks, benefits, and alternatives. Specifically, we discussed the risks of infection, bleeding, tissue injury, clip migration, and inadequate sampling. Informed, written consent was given. The usual time out protocol was performed immediately prior to the procedures. MRI GUIDED CORE NEEDLE BIOPSY OF THE LEFT BREAST #1 (0.5 cm anterior LOWER OUTER LEFT breast mass-CYLINDER clip): Using sterile technique, 1% Lidocaine with and without epinephrine, MRI guidance, and a 9 gauge vacuum assisted device, biopsy was performed of the 0.5 cm anterior LOWER OUTER LEFT breast mass using a LATERAL approach. At the conclusion of the procedure, a CYLINDER tissue marker clip was deployed into the biopsy cavity. Follow-up 2-view mammogram was performed and dictated separately. MRI GUIDED CORE NEEDLE BIOPSY OF THE LEFT BREAST #2 (0.5 cm far LOWER OUTER LEFT breast mass-BARBELL clip): Using sterile technique, 1% Lidocaine with and without epinephrine, MRI guidance, and a 9 gauge vacuum assisted device, biopsy was performed of the 0.5 cm far LOWER OUTER LEFT breast mass using a LATERAL approach. At the conclusion of the procedure, a BARBELL tissue marker clip was deployed into the biopsy cavity. Follow-up 2-view mammogram was performed and dictated separately. IMPRESSION: MRI guided biopsy of 2 separate 0.5 cm LOWER OUTER LEFT breast masses. No apparent complications. Electronically Signed: By: Margarette Canada M.D. 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 This was found to be concordant by Dr. Hassan Rowan. Pathology revealed FIBROCYSTIC  CHANGES WITH USUAL DUCTAL HYPERPLASIA AND FLORID PAPILLARY APOCRINE HYPERPLASIA of the LEFT breast, far lower outer, (barbell clip). This was found to be concordant by Dr. Hassan Rowan. Pathology results were discussed with the patient by telephone. The patient reported doing well after the biopsies with tenderness at the sites. Post biopsy instructions and care were reviewed and questions were answered. The patient was encouraged to call [facility] for any additional concerns. My direct phone number was provided. The patient was instructed to return for a bilateral breast MRI in 6 months, per protocol, and to continue annual screening mammography, due in July 2023. Pathology results reported by Terie Purser, RN on 01/20/2021. Electronically Signed   By: Margarette Canada M.D.   On: 01/20/2021 12:40   Result Date: 01/20/2021 CLINICAL  DATA:  30 year old female for tissue sampling of two separate 0.5 cm LOWER OUTER LEFT breast masses-1 within the anterior LOWER OUTER LEFT breast (CYLINDER clip) and 1 further within the LOWER OUTER LEFT breast (BARBELL clip). EXAM: MRI GUIDED CORE NEEDLE BIOPSY OF THE LEFT BREAST X 2 TECHNIQUE: Multiplanar, multisequence MR imaging of the breast was performed both before and after administration of intravenous contrast. CONTRAST:  59mL GADAVIST GADOBUTROL 1 MMOL/ML IV SOLN COMPARISON:  Previous exams. FINDINGS: I met with the patient, and we discussed the procedures of MRI guided biopsy, including risks, benefits, and alternatives. Specifically, we discussed the risks of infection, bleeding, tissue injury, clip migration, and inadequate sampling. Informed, written consent was given. The usual time out protocol was performed immediately prior to the procedures. MRI GUIDED CORE NEEDLE BIOPSY OF THE LEFT BREAST #1 (0.5 cm anterior LOWER OUTER LEFT breast mass-CYLINDER clip): Using sterile technique, 1% Lidocaine with and without epinephrine, MRI guidance, and a 9 gauge  vacuum assisted device, biopsy was performed of the 0.5 cm anterior LOWER OUTER LEFT breast mass using a LATERAL approach. At the conclusion of the procedure, a CYLINDER tissue marker clip was deployed into the biopsy cavity. Follow-up 2-view mammogram was performed and dictated separately. MRI GUIDED CORE NEEDLE BIOPSY OF THE LEFT BREAST #2 (0.5 cm far LOWER OUTER LEFT breast mass-BARBELL clip): Using sterile technique, 1% Lidocaine with and without epinephrine, MRI guidance, and a 9 gauge vacuum assisted device, biopsy was performed of the 0.5 cm far LOWER OUTER LEFT breast mass using a LATERAL approach. At the conclusion of the procedure, a BARBELL tissue marker clip was deployed into the biopsy cavity. Follow-up 2-view mammogram was performed and dictated separately. IMPRESSION: MRI guided biopsy of 2 separate 0.5 cm LOWER OUTER LEFT breast masses. No apparent complications. Electronically Signed: By: Margarette Canada M.D. On: 01/19/2021 11:08    All questions were answered. The patient knows to call the clinic with any problems, questions or concerns. I spent 15 minutes in the care of this patient including H and P, review of records, counseling and coordination of care.     Benay Pike, MD 01/26/2021 11:03 AM

## 2021-07-27 ENCOUNTER — Other Ambulatory Visit: Payer: Self-pay

## 2021-07-27 ENCOUNTER — Inpatient Hospital Stay: Payer: BC Managed Care – PPO | Attending: Hematology and Oncology | Admitting: Hematology and Oncology

## 2021-07-27 ENCOUNTER — Encounter: Payer: Self-pay | Admitting: Hematology and Oncology

## 2021-07-27 VITALS — BP 120/76 | HR 87 | Temp 97.8°F | Resp 16 | Ht 63.0 in | Wt 209.8 lb

## 2021-07-27 DIAGNOSIS — Z803 Family history of malignant neoplasm of breast: Secondary | ICD-10-CM | POA: Insufficient documentation

## 2021-07-27 DIAGNOSIS — Z800 Family history of malignant neoplasm of digestive organs: Secondary | ICD-10-CM | POA: Insufficient documentation

## 2021-07-27 DIAGNOSIS — Z1501 Genetic susceptibility to malignant neoplasm of breast: Secondary | ICD-10-CM | POA: Diagnosis present

## 2021-07-27 DIAGNOSIS — Z1509 Genetic susceptibility to other malignant neoplasm: Secondary | ICD-10-CM

## 2021-07-27 DIAGNOSIS — Z1589 Genetic susceptibility to other disease: Secondary | ICD-10-CM | POA: Diagnosis not present

## 2021-07-27 NOTE — Progress Notes (Signed)
[facility] ?CONSULT NOTE ? ?Patient Care Team: ?Pcp, No as PCP - General ? ?CHIEF COMPLAINTS/PURPOSE OF CONSULTATION:  ? ?Heterozygous for ATM mutation ? ?ASSESSMENT & PLAN:  ? ?This is a very pleasant 31 yr old female patient with ATM heterozygous mutation referred to high risk breast clinic for recommendations. ?During her last visit, we discussed about mammogram and MRI given her life time risk of breast cancer. ?She underwent breast MRI which showed two indeterminate masses in the left breast. No suspicious findings on the right. 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 ?Breast exam today, some palpable abnormality in the left breast at upper inner quadrant at around 11 o'clock position, feels cystic on exam.   ?I have recommended her to reschedule an MRI since this was recommended by radiology.  She will proceed with MRI now and will proceed with mammogram in 6 months.  She is also consider gender change, currently started on testosterone gel uses it twice a day and will undergo bilateral mastectomy eventually. ?There is a small risk of testosterone conversion to estrogen and increased risk of breast cancer.  She is however considering bilateral mastectomy soon, we will continue to monitor. ?She will return to clinic in 6 months. ?She was encouraged to contact us sooner with any new questions or concerns. ?She was given the phone number to call for another MRI. ? ?HISTORY OF PRESENTING ILLNESS:  ? ?Monique Richmond 31 y.o. female is here because of ATM mutation ? ?Ms Soliz is here for a follow up visit.  ?She has not palpated any changes in her breast.  She has now started using some testosterone gel twice a day in anticipation of gender change. ?She is worried about the cost of MRI hence did not  reschedule. ?No change in breathing or bowel habits or urinary habits. ?Rest of the pertinent 10 point ROS reviewed and negative. ? ?REVIEW OF SYSTEMS:   ?Constitutional: Denies fevers, chills or abnormal night sweats ?Eyes: Denies blurriness of vision, double vision or watery eyes ?Ears, nose, mouth, throat, and face: Denies mucositis or sore throat ?Respiratory: Denies cough, dyspnea or wheezes ?Cardiovascular: Denies palpitation, chest discomfort or lower extremity swelling ?Gastrointestinal:  Denies nausea, heartburn or change in bowel habits ?Skin: Denies abnormal skin rashes ?Lymphatics: Denies new lymphadenopathy or easy bruising ?Neurological:Denies numbness, tingling or new weaknesses ?Behavioral/Psych: Mood is stable, no new changes  ?All other systems were reviewed with the patient and are negative. ? ?MEDICAL HISTORY:  ?Past Medical History:  ?Diagnosis Date  ? Family history of breast cancer   ? Family history of colon cancer 04/26/2020  ? Family history of colon cancer   ? ? ?SURGICAL HISTORY: ?No significant PSH ? ?SOCIAL HISTORY: ?Social History  ? ?Socioeconomic History  ? Marital status: Married  ?  Spouse name: Not on file  ? Number of children: Not on file  ? Years of education: Not on file  ? Highest education level: Not on file  ?Occupational History  ? Not on file  ?Tobacco Use  ? Smoking status: Not on file  ? Smokeless tobacco: Not on file  ?Substance and Sexual Activity  ? Alcohol use: Not on file  ? Drug use: Not on file  ? Sexual activity: Not on file  ?Other Topics Concern  ? Not on file  ?  Social History Narrative  ? Not on file  ? ?Social Determinants of Health  ? ?Financial Resource Strain: Not on file  ?Food Insecurity: Not on file  ?Transportation Needs: Not on file  ?Physical Activity: Not on file  ?Stress: Not on file  ?Social Connections: Not on file  ?Intimate Partner Violence: Not on file  ? ? ?FAMILY HISTORY: ?Family History  ?Problem Relation Age of Onset  ? Colon cancer Father  55  ? Lymphoma Father 76  ? Other Father   ?     ATM+  ? Breast cancer Maternal Aunt   ? Thyroid cancer Maternal Aunt   ? Breast cancer Paternal Aunt 37  ?     ATM+  ? Dementia Maternal Grandmother   ? Heart attack Paternal Grandfather   ? Stroke Paternal Grandfather   ? Other Half-Sister   ?     ATM+  ? Breast cancer Half-Sister 36  ? Other Half-Sister   ?     ATM+  ? Breast cancer Paternal Aunt 57  ?     ATM+  ? Brain cancer Cousin 30  ?     benign brain tumors  ? ? ?ALLERGIES:  is allergic to amoxicillin, cat hair extract, and pollen extract-tree extract [pollen extract]. ? ?MEDICATIONS:  ?Current Outpatient Medications  ?Medication Sig Dispense Refill  ? buPROPion (WELLBUTRIN) 100 MG tablet Take 100 mg by mouth 2 (two) times daily.    ? Omega-3 Fatty Acids (FISH OIL) 1200 MG CAPS Take 1,200 mg by mouth once.    ? ?No current facility-administered medications for this visit.  ? ? ?PHYSICAL EXAMINATION: ?ECOG PERFORMANCE STATUS: 0 - Asymptomatic ? ?Physical Exam ?Constitutional:   ?   Appearance: Normal appearance.  ?Cardiovascular:  ?   Rate and Rhythm: Normal rate and regular rhythm.  ?Chest:  ?   Comments: Bilateral breasts inspected.  Palpable area of abnormality in the left breast upper inner quadrant, feels cystic on exam.  No palpable regional adenopathy.  Recommended proceeding with another MRI ?Musculoskeletal:  ?   Cervical back: Normal range of motion and neck supple. No rigidity.  ?Lymphadenopathy:  ?   Cervical: No cervical adenopathy.  ?Neurological:  ?   Mental Status: She is alert.  ? ? ? ?LABORATORY DATA:  ?I have reviewed the data as listed ?No results found for: WBC, HGB, HCT, MCV, PLT ?  Chemistry   ?No results found for: NA, K, CL, CO2, BUN, CREATININE, GLU No results found for: CALCIUM, ALKPHOS, AST, ALT, BILITOT  ? ? ?RADIOGRAPHIC STUDIES: ?I have personally reviewed the radiological images as listed and agreed with the findings in the report. ?No results found. ? ?All questions were  answered. The patient knows to call the clinic with any problems, questions or concerns. ?I spent 20 minutes in the care of this patient including H and P, review of records, counseling and coordination of care. ? ?  ? Benay Pike, MD ?07/27/2021 10:12 AM ? ? ?

## 2021-08-17 ENCOUNTER — Ambulatory Visit
Admission: RE | Admit: 2021-08-17 | Discharge: 2021-08-17 | Disposition: A | Discharge status: Home / Self Care | Payer: BC Managed Care – PPO | Source: Ambulatory Visit | Attending: Hematology and Oncology | Admitting: Hematology and Oncology

## 2021-08-17 DIAGNOSIS — Z1501 Genetic susceptibility to malignant neoplasm of breast: Secondary | ICD-10-CM

## 2021-08-17 IMAGING — MR MR BREAST BILAT WO/W CM
8 of 12 series · 32 of 48 positions shown · IV contrast (gadavist)
Comparison: Previous exams.

CLINICAL DATA: High risk screening breast MRI. Family history
Calculated lifetime risk of breast cancer per [employer] Cuzick model
20%. Previous benign 2 site MRI biopsy left breast [DATE].

EXAM:
BILATERAL BREAST MRI WITH AND WITHOUT CONTRAST
TECHNIQUE: Multiplanar, multisequence MR images of both breasts were obtained
prior to and following the intravenous administration of 10 ml of
Gadavist.

[Series 2: t2_tirm_tra ipat (a-p) · axial · 3.0mm · 0.70mm/px · 1 of 55 slices shown]
[im 1/55]
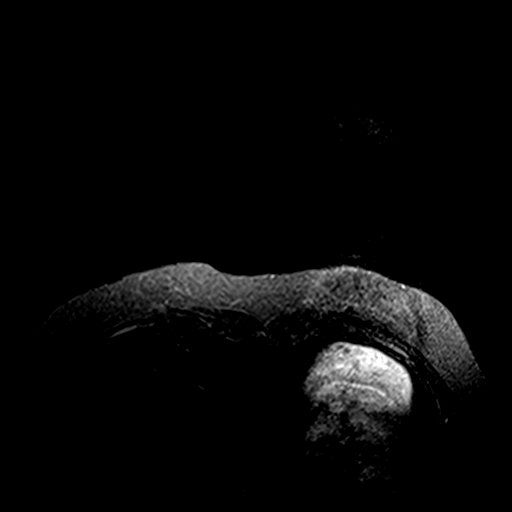

[Series 3: fl3d pre-cm no · axial · non-contrast · 1.2mm · 0.94mm/px · z∈[-86,+85]mm · 5 of 144 slices shown]
[im 1/144]
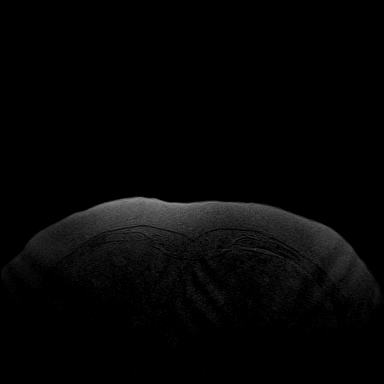
[im 36/144]
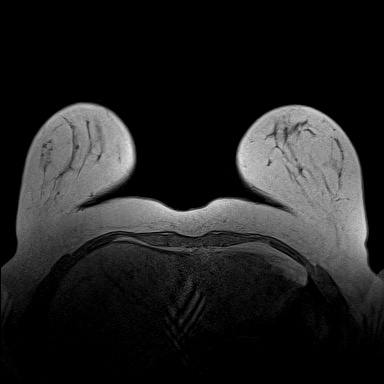
[im 72/144]
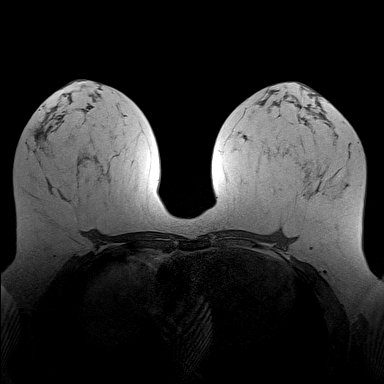
[im 108/144]
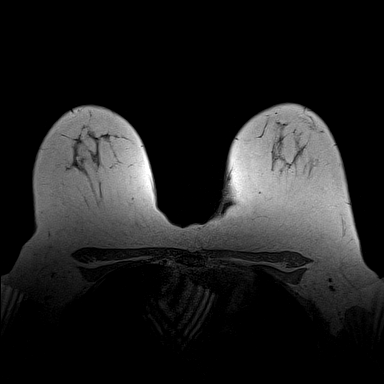
[im 144/144]
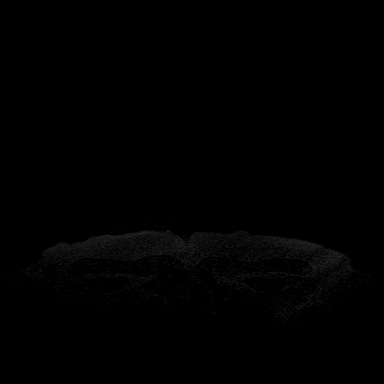

[Series 4: fl3d pre-cm · axial · non-contrast · 1.2mm · 0.94mm/px · z∈[-86,+85]mm · 5 of 144 slices shown]
[im 1/144]
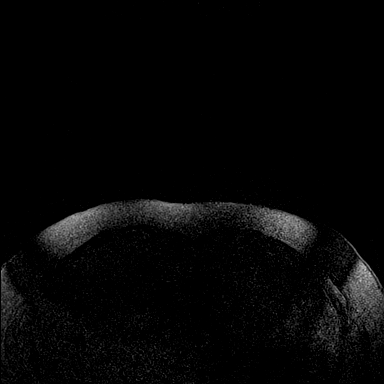
[im 36/144]
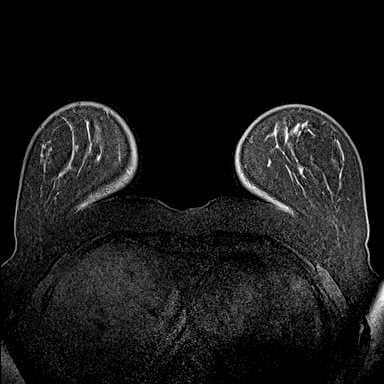
[im 72/144]
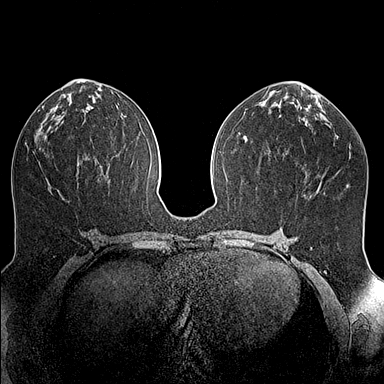
[im 108/144]
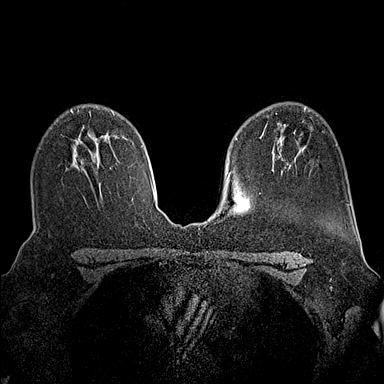
[im 144/144]
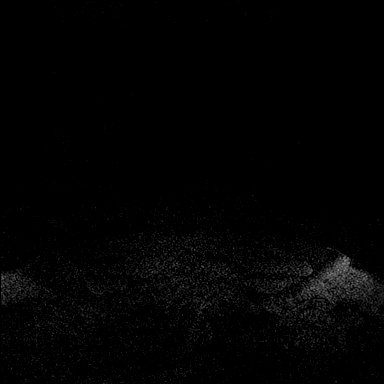

[Series 5: fl3d post-cm 20 · axial · 1.2mm · 0.94mm/px · z∈[-86,+85]mm · 5 of 144 slices shown (1 of 3)]
[im 1/144]
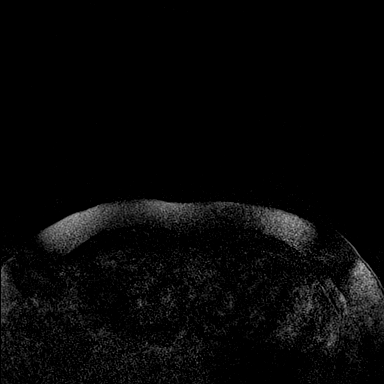
[im 36/144]
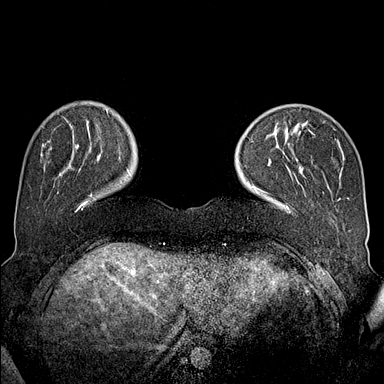
[im 72/144]
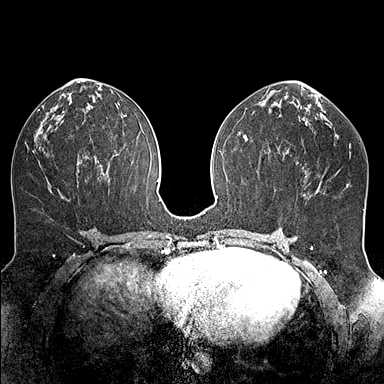
[im 108/144]
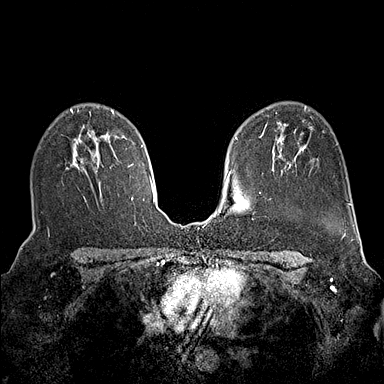
[im 144/144]
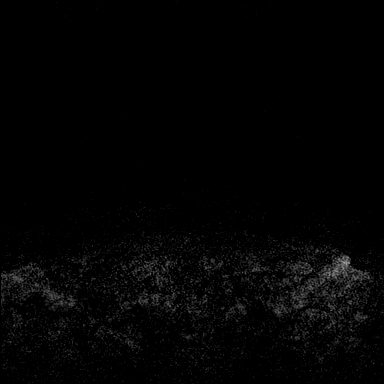

[Series 6: fl3d post-cm 20 · axial · 1.2mm · 0.94mm/px · z∈[-86,+85]mm · 5 of 144 slices shown (2 of 3)]
[im 1/144]
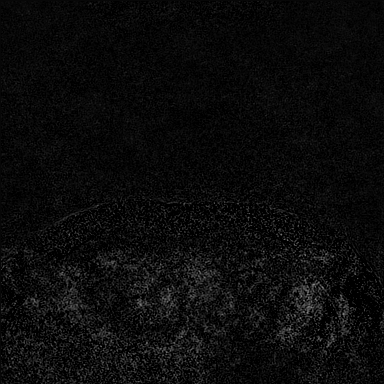
[im 36/144]
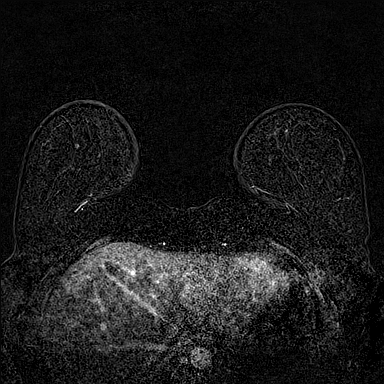
[im 72/144]
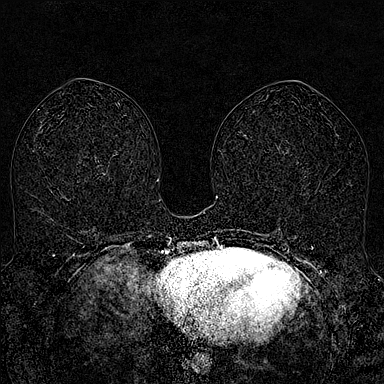
[im 108/144]
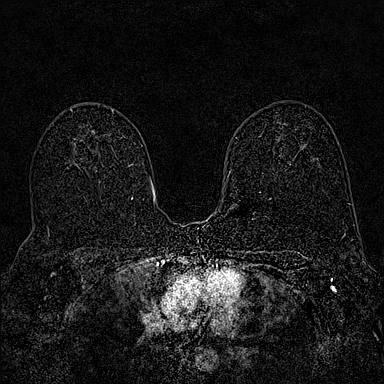
[im 144/144]
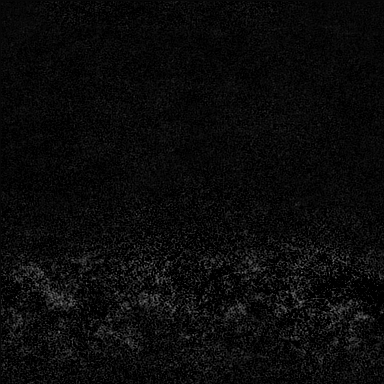

[Series 7: fl3d post-cm 20 · axial · 172.8mm · 0.94mm/px · 1 of 1 slices shown (3 of 3)]
[im 1/1]
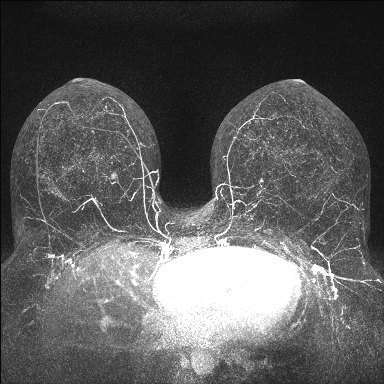

[Series 8: fl3d post-cm 3 · axial · 1.2mm · 0.94mm/px · z∈[-86,+85]mm · 6 of 144 slices shown (1 of 2)]
[im 1/144]
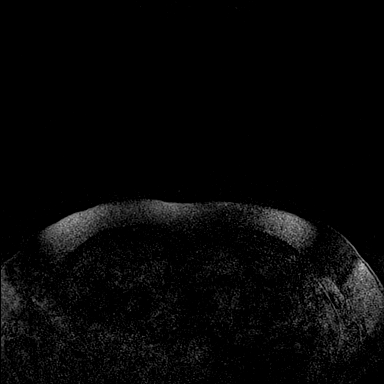
[im 29/144]
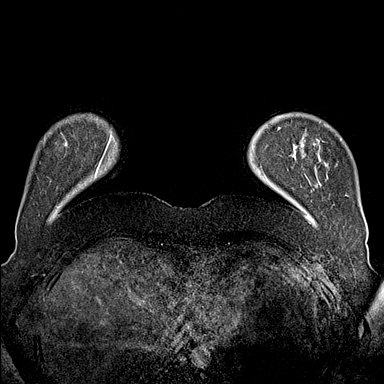
[im 58/144]
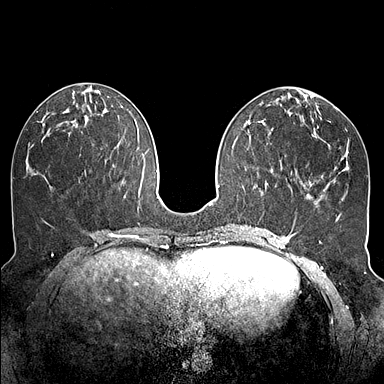
[im 86/144]
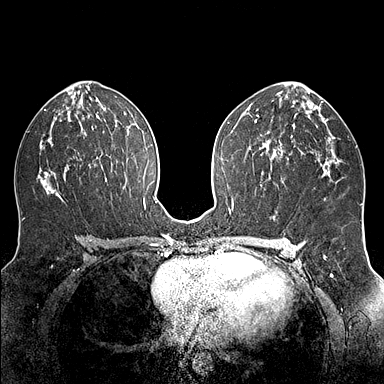
[im 115/144]
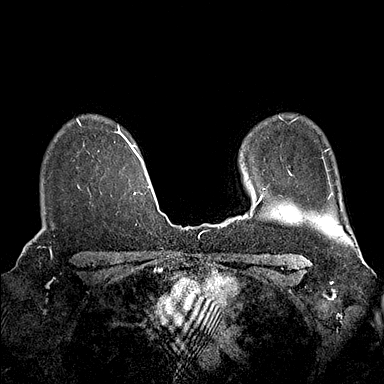
[im 144/144]
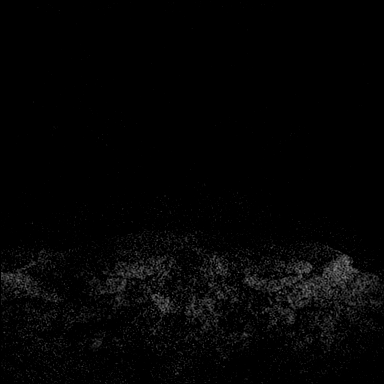

[Series 9: fl3d post-cm 3 · axial · 1.2mm · 0.94mm/px · z∈[-86,+16]mm · 4 of 144 slices shown (2 of 2)]
[im 1/144]
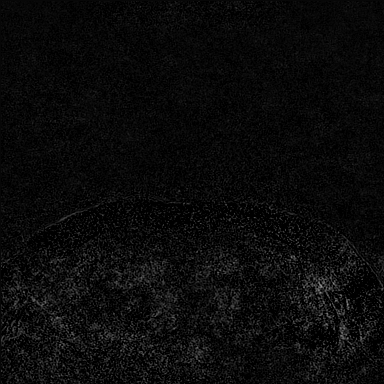
[im 29/144]
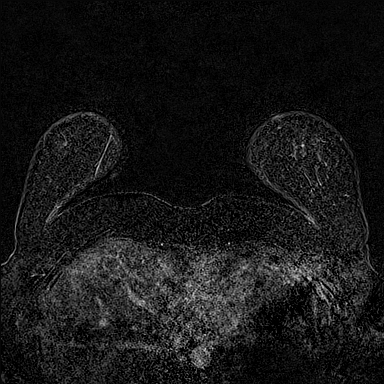
[im 58/144]
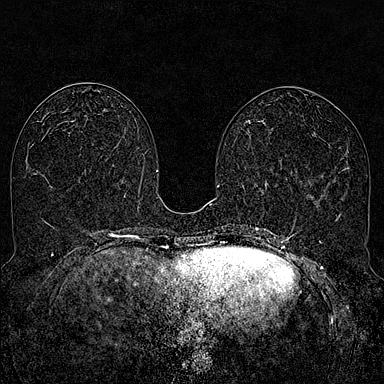
[im 86/144]
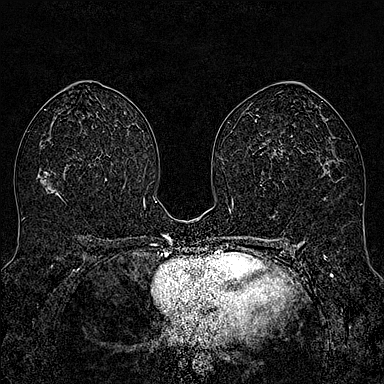

[32 of 48 positions shown; findings below may reference images not displayed]

Three-dimensional MR images were rendered by post-processing of the
original MR data on an independent workstation. The
three-dimensional MR images were interpreted, and findings are
reported in the following complete MRI report for this study. Three
dimensional images were evaluated at the independent interpreting
workstation using the DynaCAD thin client.
FINDINGS: Breast composition: b. Scattered fibroglandular tissue.

Background parenchymal enhancement: Mild

Right breast: No suspicious mass or abnormal enhancement. 7 mm
enhancing skin lesion over the inner lower breast likely a sebaceous
cyst.

Left breast: No suspicious mass or abnormal enhancement. Clip
artifact over the 2 biopsy sites in the lower periareolar region as
well as lower left breast just lateral to the midline. No
significant residual enhancement at the benign biopsy sites. Subtle
subcentimeter enhancement over the skin of the anterior inner lower
quadrant likely sebaceous cyst.

Lymph nodes: No abnormal appearing lymph nodes.

Ancillary findings:  None.
IMPRESSION: No suspicious findings within either breast. No residual enhancement
at the 2 previous benign left breast biopsy sites.

RECOMMENDATION:
Recommend continued annual high risk screening breast MRI as well as
annual 3D screening mammography.

The American Cancer Society recommends annual MRI and mammography in
patients with an estimated lifetime risk of developing breast cancer
greater than 20 - 25%, or who are known or suspected to be positive
for the breast cancer gene.

BI-RADS CATEGORY  2: Benign.

## 2021-08-17 MED ORDER — GADOBUTROL 1 MMOL/ML IV SOLN
10.0000 mL | Freq: Once | INTRAVENOUS | Status: AC | PRN
  Administered 2021-08-17: 10 mL via INTRAVENOUS

## 2021-12-08 ENCOUNTER — Ambulatory Visit
Admission: RE | Admit: 2021-12-08 | Discharge: 2021-12-08 | Disposition: A | Discharge status: Home / Self Care | Payer: Managed Care, Other (non HMO) | Source: Ambulatory Visit | Attending: Hematology and Oncology | Admitting: Hematology and Oncology

## 2021-12-08 DIAGNOSIS — Z1501 Genetic susceptibility to malignant neoplasm of breast: Secondary | ICD-10-CM

## 2022-01-26 ENCOUNTER — Inpatient Hospital Stay: Payer: Managed Care, Other (non HMO) | Attending: Hematology and Oncology | Admitting: Hematology and Oncology

## 2022-01-26 ENCOUNTER — Encounter: Payer: Self-pay | Admitting: Hematology and Oncology

## 2022-01-26 VITALS — BP 107/81 | HR 68 | Temp 97.7°F | Resp 16 | Ht 63.0 in | Wt 216.0 lb

## 2022-01-26 DIAGNOSIS — Z1589 Genetic susceptibility to other disease: Secondary | ICD-10-CM

## 2022-01-26 DIAGNOSIS — Z808 Family history of malignant neoplasm of other organs or systems: Secondary | ICD-10-CM | POA: Diagnosis not present

## 2022-01-26 DIAGNOSIS — Z803 Family history of malignant neoplasm of breast: Secondary | ICD-10-CM | POA: Insufficient documentation

## 2022-01-26 DIAGNOSIS — Z800 Family history of malignant neoplasm of digestive organs: Secondary | ICD-10-CM | POA: Diagnosis not present

## 2022-01-26 DIAGNOSIS — Z807 Family history of other malignant neoplasms of lymphoid, hematopoietic and related tissues: Secondary | ICD-10-CM | POA: Insufficient documentation

## 2022-01-26 DIAGNOSIS — N6082 Other benign mammary dysplasias of left breast: Secondary | ICD-10-CM | POA: Insufficient documentation

## 2022-01-26 DIAGNOSIS — Z1501 Genetic susceptibility to malignant neoplasm of breast: Secondary | ICD-10-CM | POA: Insufficient documentation

## 2022-01-26 DIAGNOSIS — Z1509 Genetic susceptibility to other malignant neoplasm: Secondary | ICD-10-CM

## 2022-01-26 NOTE — Progress Notes (Signed)
CONSULT NOTE  Patient Care Team: Pcp, No as PCP - General Benay Pike, MD as Medical Oncologist (Hematology and Oncology)  CHIEF COMPLAINTS/PURPOSE OF CONSULTATION:   Heterozygous for ATM mutation  ASSESSMENT & PLAN:   This is a very pleasant 31 yr old female patient with ATM heterozygous mutation referred to high risk breast clinic for recommendations. During her last visit, we discussed about mammogram and MRI given her life time risk of breast cancer. She underwent breast MRI which showed two indeterminate masses in the left breast. No suspicious findings on the right. 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 Breast exam today, some palpable abnormality in the left breast at upper inner quadrant at around 11 o'clock position, feels cystic on exam.   I have recommended her to reschedule an MRI since this was recommended by radiology.  She will proceed with MRI now and will proceed with mammogram in 6 months.  She is also consider gender change, currently started on testosterone gel uses it twice a day and will undergo bilateral mastectomy eventually. There is a small risk of testosterone conversion to estrogen and increased risk of breast cancer.  She is however considering bilateral mastectomy soon, we will continue to monitor. She will return to clinic in 6 months. She was encouraged to contact us sooner with any new questions or concerns. She was given the phone number to call for another MRI.  HISTORY OF PRESENTING ILLNESS:   Monique Richmond 31 y.o. adult is here because of ATM mutation  Monique Richmond is here for a follow up visit.  She is doing very well overall.  She is worried about the cost of MRI hence did not reschedule. No change in breathing or bowel habits or urinary  habits. Rest of the pertinent 10 point ROS reviewed and negative.  REVIEW OF SYSTEMS:   Constitutional: Denies fevers, chills or abnormal night sweats Eyes: Denies blurriness of vision, double vision or watery eyes Ears, nose, mouth, throat, and face: Denies mucositis or sore throat Respiratory: Denies cough, dyspnea or wheezes Cardiovascular: Denies palpitation, chest discomfort or lower extremity swelling Gastrointestinal:  Denies nausea, heartburn or change in bowel habits Skin: Denies abnormal skin rashes Lymphatics: Denies new lymphadenopathy or easy bruising Neurological:Denies numbness, tingling or new weaknesses Behavioral/Psych: Mood is stable, no new changes  All other systems were reviewed with the patient and are negative.  MEDICAL HISTORY:  Past Medical History:  Diagnosis Date  . Family history of breast cancer   . Family history of colon cancer 04/26/2020  . Family history of colon cancer     SURGICAL HISTORY: No significant PSH  SOCIAL HISTORY: Social History   Socioeconomic History  . Marital status: Married    Spouse name: Not on file  . Number of children: Not on file  . Years of education: Not on file  . Highest education level: Not on file  Occupational History  . Not on file  Tobacco Use  . Smoking status: Not on file  . Smokeless tobacco: Not on file  Substance and Sexual Activity  . Alcohol use: Not on file  . Drug use: Not on file  . Sexual activity: Not on file  Other Topics Concern  . Not on file  Social History Narrative  . Not on file   Social Determinants of Health  Financial Resource Strain: Not on file  Food Insecurity: Not on file  Transportation Needs: Not on file  Physical Activity: Not on file  Stress: Not on file  Social Connections: Not on file  Intimate Partner Violence: Not on file    FAMILY HISTORY: Family History  Problem Relation Age of Onset  . Colon cancer Father 66  . Lymphoma Father 67  . Other Father         ATM+  . Breast cancer Maternal Aunt   . Thyroid cancer Maternal Aunt   . Breast cancer Paternal Aunt 70       ATM+  . Dementia Maternal Grandmother   . Heart attack Paternal Grandfather   . Stroke Paternal Grandfather   . Other Half-Sister        ATM+  . Breast cancer Half-Sister 40  . Other Half-Sister        ATM+  . Breast cancer Paternal Aunt 31       ATM+  . Brain cancer Cousin 30       benign brain tumors    ALLERGIES:  is allergic to amoxicillin, cat hair extract, and pollen extract-tree extract [pollen extract].  MEDICATIONS:  Current Outpatient Medications  Medication Sig Dispense Refill  . buPROPion (WELLBUTRIN) 100 MG tablet Take 100 mg by mouth 2 (two) times daily.    . Omega-3 Fatty Acids (FISH OIL) 1200 MG CAPS Take 1,200 mg by mouth once.     No current facility-administered medications for this visit.    PHYSICAL EXAMINATION: ECOG PERFORMANCE STATUS: 0 - Asymptomatic  Physical Exam Constitutional:      Appearance: Normal appearance.  Cardiovascular:     Rate and Rhythm: Normal rate and regular rhythm.  Chest:     Comments: Bilateral breasts inspected.  Palpable area of abnormality in the left breast upper inner quadrant, feels cystic on exam.  No palpable regional adenopathy.  Recommended proceeding with another MRI Musculoskeletal:     Cervical back: Normal range of motion and neck supple. No rigidity.  Lymphadenopathy:     Cervical: No cervical adenopathy.  Neurological:     Mental Status: Monique Richmond is alert.     LABORATORY DATA:  I have reviewed the data as listed No results found for: "WBC", "HGB", "HCT", "MCV", "PLT"   Chemistry   No results found for: "NA", "K", "CL", "CO2", "BUN", "CREATININE", "GLU" No results found for: "CALCIUM", "ALKPHOS", "AST", "ALT", "BILITOT"    RADIOGRAPHIC STUDIES: I have personally reviewed the radiological images as listed and agreed with the findings in the report. No results found.  All questions  were answered. The patient knows to call the clinic with any problems, questions or concerns. I spent 20 minutes in the care of this patient including H and P, review of records, counseling and coordination of care.     Benay Pike, MD 01/26/2022 9:43 AM

## 2022-01-26 NOTE — Progress Notes (Signed)
Monique Richmond CONSULT NOTE  Patient Care Team: Pcp, No as PCP - General Benay Pike, MD as Medical Oncologist (Hematology and Oncology)  CHIEF COMPLAINTS/PURPOSE OF CONSULTATION:   Heterozygous for ATM mutation  ASSESSMENT & PLAN:   This is a very pleasant 31 yr old female patient with ATM heterozygous mutation referred to high risk breast clinic for recommendations. During her last visit, we discussed about mammogram and MRI given her life time risk of breast cancer. She underwent breast MRI which showed two indeterminate masses in the left breast. No suspicious findings on the right. 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 Breast exam once again she has some abnormal cystic changes which have been previously investigated. Her last mammogram on August 24 did not show any evidence of malignancy. She is also currently on testosterone for gender change.  She understands the small risk of breast cancer with the conversion of testosterone to estrogen, we have talked about this multiple times.  She is also hoping to consider bilateral mastectomy soon. We will continue to alternate mammograms and MRIs.  Return to clinic in 6 months or sooner as needed.  Self breast exam recommended monthly  HISTORY OF PRESENTING ILLNESS:   Monique Richmond 30 y.o. adult is here because of ATM mutation  Monique Richmond is here for a follow up visit.  She has not palpated any changes in her breast.   She continues on testosterone once a week, working on gender change.  She is also hoping to get through bilateral mastectomy eventually.  No other health concerns.  MEDICAL HISTORY:  Past Medical History:  Diagnosis Date   Family history of breast cancer    Family history of colon cancer 04/26/2020    Family history of colon cancer     SURGICAL HISTORY: No significant PSH  SOCIAL HISTORY: Social History   Socioeconomic History   Marital status: Married    Spouse name: Not on file   Number of children: Not on file   Years of education: Not on file   Highest education level: Not on file  Occupational History   Not on file  Tobacco Use   Smoking status: Not on file   Smokeless tobacco: Not on file  Substance and Sexual Activity   Alcohol use: Not on file   Drug use: Not on file   Sexual activity: Not on file  Other Topics Concern   Not on file  Social History Narrative   Not on file   Social Determinants of Health   Financial Resource Strain: Not on file  Food Insecurity: Not on file  Transportation Needs: Not on file  Physical Activity: Not on file  Stress: Not on file  Social Connections: Not on file  Intimate Partner Violence: Not on file    FAMILY HISTORY: Family History  Problem Relation Age of Onset   Colon cancer Father 51   Lymphoma Father 82   Other Father        ATM+   Breast cancer Maternal Aunt    Thyroid cancer Maternal Aunt    Breast cancer Paternal Aunt 93       ATM+   Dementia Maternal Grandmother    Heart attack Paternal Grandfather    Stroke Paternal Jon Gills    Other Half-Sister  ATM+   Breast cancer Half-Sister 60   Other Half-Sister        ATM+   Breast cancer Paternal Aunt 28       ATM+   Brain cancer Cousin 30       benign brain tumors    ALLERGIES:  is allergic to amoxicillin, cat hair extract, and pollen extract-tree extract [pollen extract].  MEDICATIONS:  Current Outpatient Medications  Medication Sig Dispense Refill   buPROPion (WELLBUTRIN) 100 MG tablet Take 100 mg by mouth 2 (two) times daily.     Omega-3 Fatty Acids (FISH OIL) 1200 MG CAPS Take 1,200 mg by mouth once.     No current facility-administered medications for this visit.    PHYSICAL EXAMINATION: ECOG PERFORMANCE STATUS: 0 -  Asymptomatic  General appearance: Alert, oriented and in no acute distress. Physical exam: Bilateral breasts inspected.  No palpable masses.  No regional adenopathy.   LABORATORY DATA:  I have reviewed the data as listed No results found for: "WBC", "HGB", "HCT", "MCV", "PLT"   Chemistry   No results found for: "NA", "K", "CL", "CO2", "BUN", "CREATININE", "GLU" No results found for: "CALCIUM", "ALKPHOS", "AST", "ALT", "BILITOT"    RADIOGRAPHIC STUDIES: I have personally reviewed the radiological images as listed and agreed with the findings in the report. No results found.  All questions were answered. The patient knows to call the clinic with any problems, questions or concerns. I spent 20 minutes in the care of this patient including H and P, review of records, counseling and coordination of care.     Benay Pike, MD 01/26/2022 11:27 AM

## 2022-06-12 ENCOUNTER — Telehealth: Payer: Self-pay | Admitting: Hematology and Oncology

## 2022-06-12 NOTE — Telephone Encounter (Signed)
Reached out to reschedule patient, patient aware of date and time of appointment.

## 2022-07-20 ENCOUNTER — Telehealth: Payer: Self-pay | Admitting: Hematology and Oncology

## 2022-07-20 NOTE — Telephone Encounter (Signed)
Called patient per 4/3 IB message to cancel may appointment due to [place] out of [place]. Patient notified.

## 2022-08-25 ENCOUNTER — Encounter: Payer: Self-pay | Admitting: Hematology and Oncology

## 2022-08-30 ENCOUNTER — Other Ambulatory Visit: Payer: Managed Care, Other (non HMO)

## 2022-09-01 ENCOUNTER — Ambulatory Visit: Payer: Managed Care, Other (non HMO) | Admitting: Hematology and Oncology

## 2022-09-04 ENCOUNTER — Ambulatory Visit: Payer: Managed Care, Other (non HMO) | Admitting: Hematology and Oncology

## 2022-11-27 ENCOUNTER — Telehealth: Payer: Self-pay | Admitting: *Deleted

## 2022-11-27 NOTE — Telephone Encounter (Signed)
This RN received message from pt (goes by Monique Richmond) stating need for CD's of breast tissue films due to now relocated to the [place] area and will be seen at [facility].  This RN returned call - obtained identified VM - detailed message left informing Monique Richmond of need of fax ideally from the MD office they will be established - films will then be uploaded to a system that allows [employer] to download the films.  This RN's fax number given as well return call number.

## 2023-06-06 ENCOUNTER — Encounter: Payer: Self-pay | Admitting: Genetic Counselor
